# Patient Record
Sex: Male | Born: 1955 | Race: White | Hispanic: No | Marital: Married | Smoking: Never smoker
Health system: Southern US, Community
[De-identification: ages and names within clinical notes are randomized; demographics above are authoritative.]

## PROBLEM LIST (undated history)

## (undated) DIAGNOSIS — I10 Essential (primary) hypertension: Secondary | ICD-10-CM

## (undated) DIAGNOSIS — G4730 Sleep apnea, unspecified: Secondary | ICD-10-CM

## (undated) DIAGNOSIS — C6290 Malignant neoplasm of unspecified testis, unspecified whether descended or undescended: Secondary | ICD-10-CM

## (undated) DIAGNOSIS — E785 Hyperlipidemia, unspecified: Secondary | ICD-10-CM

## (undated) DIAGNOSIS — E119 Type 2 diabetes mellitus without complications: Secondary | ICD-10-CM

## (undated) HISTORY — DX: Type 2 diabetes mellitus without complications: E11.9

## (undated) HISTORY — PX: RADICAL ORCHIECTOMY: SHX2285

## (undated) HISTORY — DX: Sleep apnea, unspecified: G47.30

## (undated) HISTORY — DX: Essential (primary) hypertension: I10

## (undated) HISTORY — DX: Hyperlipidemia, unspecified: E78.5

---

## 2002-03-16 ENCOUNTER — Encounter: Payer: Self-pay | Admitting: Emergency Medicine

## 2002-03-16 ENCOUNTER — Emergency Department (HOSPITAL_COMMUNITY): Admission: EM | Admit: 2002-03-16 | Discharge: 2002-03-16 | Payer: Self-pay | Admitting: Emergency Medicine

## 2011-12-17 ENCOUNTER — Emergency Department (HOSPITAL_COMMUNITY): Payer: BC Managed Care – PPO

## 2011-12-17 ENCOUNTER — Encounter (HOSPITAL_COMMUNITY): Payer: Self-pay | Admitting: *Deleted

## 2011-12-17 ENCOUNTER — Emergency Department (HOSPITAL_COMMUNITY)
Admission: EM | Admit: 2011-12-17 | Discharge: 2011-12-18 | Disposition: A | Discharge status: Home / Self Care | Payer: BC Managed Care – PPO | Attending: Emergency Medicine | Admitting: Emergency Medicine

## 2011-12-17 DIAGNOSIS — Z8547 Personal history of malignant neoplasm of testis: Secondary | ICD-10-CM | POA: Insufficient documentation

## 2011-12-17 DIAGNOSIS — S01112A Laceration without foreign body of left eyelid and periocular area, initial encounter: Secondary | ICD-10-CM

## 2011-12-17 DIAGNOSIS — S52009A Unspecified fracture of upper end of unspecified ulna, initial encounter for closed fracture: Secondary | ICD-10-CM | POA: Insufficient documentation

## 2011-12-17 DIAGNOSIS — S0180XA Unspecified open wound of other part of head, initial encounter: Secondary | ICD-10-CM | POA: Insufficient documentation

## 2011-12-17 DIAGNOSIS — S43402A Unspecified sprain of left shoulder joint, initial encounter: Secondary | ICD-10-CM

## 2011-12-17 DIAGNOSIS — IMO0002 Reserved for concepts with insufficient information to code with codable children: Secondary | ICD-10-CM | POA: Insufficient documentation

## 2011-12-17 DIAGNOSIS — Z880 Allergy status to penicillin: Secondary | ICD-10-CM | POA: Insufficient documentation

## 2011-12-17 DIAGNOSIS — S42402A Unspecified fracture of lower end of left humerus, initial encounter for closed fracture: Secondary | ICD-10-CM

## 2011-12-17 HISTORY — DX: Malignant neoplasm of unspecified testis, unspecified whether descended or undescended: C62.90

## 2011-12-17 MED ORDER — OXYCODONE-ACETAMINOPHEN 5-325 MG PO TABS
1.0000 | ORAL_TABLET | Freq: Once | ORAL | Status: AC
  Administered 2011-12-17: 1 via ORAL
  Filled 2011-12-17: qty 1

## 2011-12-17 MED ORDER — IBUPROFEN 200 MG PO TABS
400.0000 mg | ORAL_TABLET | Freq: Once | ORAL | Status: AC
  Administered 2011-12-17: 400 mg via ORAL
  Filled 2011-12-17: qty 2
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
## 2011-12-17 NOTE — ED Notes (Signed)
No changes, to xray via w/c, pain meds given, pt discussed with Dr. Anitra Lauth, pt declining head CT at this time.

## 2011-12-17 NOTE — ED Notes (Addendum)
Back from xray via w/c, no changes, son at side. Again, "wanting to wait to see provider prior to CT head". Talking on cell phone, watching TV.

## 2011-12-18 MED ORDER — OXYCODONE-ACETAMINOPHEN 5-325 MG PO TABS: 2.0000 | ORAL_TABLET | ORAL | Status: DC | PRN

## 2011-12-18 NOTE — ED Provider Notes (Signed)
Medical screening examination/treatment/procedure(s) were performed by non-physician practitioner and as supervising physician I was immediately available for consultation/collaboration.  Sierrah Luevano M Lonisha Bobby, MD 12/18/11 0456 

## 2016-02-21 DIAGNOSIS — N390 Urinary tract infection, site not specified: Secondary | ICD-10-CM | POA: Diagnosis not present

## 2016-05-29 DIAGNOSIS — G4733 Obstructive sleep apnea (adult) (pediatric): Secondary | ICD-10-CM | POA: Diagnosis not present

## 2016-06-05 DIAGNOSIS — M542 Cervicalgia: Secondary | ICD-10-CM | POA: Diagnosis not present

## 2016-06-05 DIAGNOSIS — G8929 Other chronic pain: Secondary | ICD-10-CM | POA: Diagnosis not present

## 2016-06-05 DIAGNOSIS — M25512 Pain in left shoulder: Secondary | ICD-10-CM | POA: Diagnosis not present

## 2016-07-25 DIAGNOSIS — M5384 Other specified dorsopathies, thoracic region: Secondary | ICD-10-CM | POA: Diagnosis not present

## 2016-07-25 DIAGNOSIS — M50121 Cervical disc disorder at C4-C5 level with radiculopathy: Secondary | ICD-10-CM | POA: Diagnosis not present

## 2016-07-25 DIAGNOSIS — M9902 Segmental and somatic dysfunction of thoracic region: Secondary | ICD-10-CM | POA: Diagnosis not present

## 2016-07-25 DIAGNOSIS — M9901 Segmental and somatic dysfunction of cervical region: Secondary | ICD-10-CM | POA: Diagnosis not present

## 2016-07-30 DIAGNOSIS — M9901 Segmental and somatic dysfunction of cervical region: Secondary | ICD-10-CM | POA: Diagnosis not present

## 2016-07-30 DIAGNOSIS — M50121 Cervical disc disorder at C4-C5 level with radiculopathy: Secondary | ICD-10-CM | POA: Diagnosis not present

## 2016-07-30 DIAGNOSIS — M9902 Segmental and somatic dysfunction of thoracic region: Secondary | ICD-10-CM | POA: Diagnosis not present

## 2016-07-30 DIAGNOSIS — M5384 Other specified dorsopathies, thoracic region: Secondary | ICD-10-CM | POA: Diagnosis not present

## 2016-08-01 DIAGNOSIS — M50121 Cervical disc disorder at C4-C5 level with radiculopathy: Secondary | ICD-10-CM | POA: Diagnosis not present

## 2016-08-01 DIAGNOSIS — M9901 Segmental and somatic dysfunction of cervical region: Secondary | ICD-10-CM | POA: Diagnosis not present

## 2016-08-01 DIAGNOSIS — M9902 Segmental and somatic dysfunction of thoracic region: Secondary | ICD-10-CM | POA: Diagnosis not present

## 2016-08-01 DIAGNOSIS — M5384 Other specified dorsopathies, thoracic region: Secondary | ICD-10-CM | POA: Diagnosis not present

## 2016-08-05 DIAGNOSIS — M9902 Segmental and somatic dysfunction of thoracic region: Secondary | ICD-10-CM | POA: Diagnosis not present

## 2016-08-05 DIAGNOSIS — M50121 Cervical disc disorder at C4-C5 level with radiculopathy: Secondary | ICD-10-CM | POA: Diagnosis not present

## 2016-08-05 DIAGNOSIS — M9901 Segmental and somatic dysfunction of cervical region: Secondary | ICD-10-CM | POA: Diagnosis not present

## 2016-08-05 DIAGNOSIS — M5384 Other specified dorsopathies, thoracic region: Secondary | ICD-10-CM | POA: Diagnosis not present

## 2016-08-06 DIAGNOSIS — M50121 Cervical disc disorder at C4-C5 level with radiculopathy: Secondary | ICD-10-CM | POA: Diagnosis not present

## 2016-08-06 DIAGNOSIS — M9901 Segmental and somatic dysfunction of cervical region: Secondary | ICD-10-CM | POA: Diagnosis not present

## 2016-08-06 DIAGNOSIS — M5384 Other specified dorsopathies, thoracic region: Secondary | ICD-10-CM | POA: Diagnosis not present

## 2016-08-06 DIAGNOSIS — M9902 Segmental and somatic dysfunction of thoracic region: Secondary | ICD-10-CM | POA: Diagnosis not present

## 2016-08-19 DIAGNOSIS — M9901 Segmental and somatic dysfunction of cervical region: Secondary | ICD-10-CM | POA: Diagnosis not present

## 2016-08-19 DIAGNOSIS — M50121 Cervical disc disorder at C4-C5 level with radiculopathy: Secondary | ICD-10-CM | POA: Diagnosis not present

## 2016-08-19 DIAGNOSIS — M9902 Segmental and somatic dysfunction of thoracic region: Secondary | ICD-10-CM | POA: Diagnosis not present

## 2016-08-19 DIAGNOSIS — M5384 Other specified dorsopathies, thoracic region: Secondary | ICD-10-CM | POA: Diagnosis not present

## 2016-08-20 DIAGNOSIS — M9901 Segmental and somatic dysfunction of cervical region: Secondary | ICD-10-CM | POA: Diagnosis not present

## 2016-08-20 DIAGNOSIS — M50121 Cervical disc disorder at C4-C5 level with radiculopathy: Secondary | ICD-10-CM | POA: Diagnosis not present

## 2016-08-20 DIAGNOSIS — M5384 Other specified dorsopathies, thoracic region: Secondary | ICD-10-CM | POA: Diagnosis not present

## 2016-08-20 DIAGNOSIS — M9902 Segmental and somatic dysfunction of thoracic region: Secondary | ICD-10-CM | POA: Diagnosis not present

## 2016-08-22 DIAGNOSIS — M9902 Segmental and somatic dysfunction of thoracic region: Secondary | ICD-10-CM | POA: Diagnosis not present

## 2016-08-22 DIAGNOSIS — M5384 Other specified dorsopathies, thoracic region: Secondary | ICD-10-CM | POA: Diagnosis not present

## 2016-08-22 DIAGNOSIS — M9901 Segmental and somatic dysfunction of cervical region: Secondary | ICD-10-CM | POA: Diagnosis not present

## 2016-08-22 DIAGNOSIS — M50121 Cervical disc disorder at C4-C5 level with radiculopathy: Secondary | ICD-10-CM | POA: Diagnosis not present

## 2016-08-26 DIAGNOSIS — M9902 Segmental and somatic dysfunction of thoracic region: Secondary | ICD-10-CM | POA: Diagnosis not present

## 2016-08-26 DIAGNOSIS — M5384 Other specified dorsopathies, thoracic region: Secondary | ICD-10-CM | POA: Diagnosis not present

## 2016-08-26 DIAGNOSIS — M9901 Segmental and somatic dysfunction of cervical region: Secondary | ICD-10-CM | POA: Diagnosis not present

## 2016-08-26 DIAGNOSIS — M50121 Cervical disc disorder at C4-C5 level with radiculopathy: Secondary | ICD-10-CM | POA: Diagnosis not present

## 2016-08-27 DIAGNOSIS — M9902 Segmental and somatic dysfunction of thoracic region: Secondary | ICD-10-CM | POA: Diagnosis not present

## 2016-08-27 DIAGNOSIS — M5384 Other specified dorsopathies, thoracic region: Secondary | ICD-10-CM | POA: Diagnosis not present

## 2016-08-27 DIAGNOSIS — M9901 Segmental and somatic dysfunction of cervical region: Secondary | ICD-10-CM | POA: Diagnosis not present

## 2016-08-27 DIAGNOSIS — M50121 Cervical disc disorder at C4-C5 level with radiculopathy: Secondary | ICD-10-CM | POA: Diagnosis not present

## 2016-08-29 DIAGNOSIS — A084 Viral intestinal infection, unspecified: Secondary | ICD-10-CM | POA: Diagnosis not present

## 2016-09-02 DIAGNOSIS — M50121 Cervical disc disorder at C4-C5 level with radiculopathy: Secondary | ICD-10-CM | POA: Diagnosis not present

## 2016-09-02 DIAGNOSIS — M9902 Segmental and somatic dysfunction of thoracic region: Secondary | ICD-10-CM | POA: Diagnosis not present

## 2016-09-02 DIAGNOSIS — M5384 Other specified dorsopathies, thoracic region: Secondary | ICD-10-CM | POA: Diagnosis not present

## 2016-09-02 DIAGNOSIS — M9901 Segmental and somatic dysfunction of cervical region: Secondary | ICD-10-CM | POA: Diagnosis not present

## 2016-09-03 DIAGNOSIS — M9902 Segmental and somatic dysfunction of thoracic region: Secondary | ICD-10-CM | POA: Diagnosis not present

## 2016-09-03 DIAGNOSIS — M50121 Cervical disc disorder at C4-C5 level with radiculopathy: Secondary | ICD-10-CM | POA: Diagnosis not present

## 2016-09-03 DIAGNOSIS — M9901 Segmental and somatic dysfunction of cervical region: Secondary | ICD-10-CM | POA: Diagnosis not present

## 2016-09-03 DIAGNOSIS — M5384 Other specified dorsopathies, thoracic region: Secondary | ICD-10-CM | POA: Diagnosis not present

## 2016-09-05 DIAGNOSIS — M50121 Cervical disc disorder at C4-C5 level with radiculopathy: Secondary | ICD-10-CM | POA: Diagnosis not present

## 2016-09-05 DIAGNOSIS — M9902 Segmental and somatic dysfunction of thoracic region: Secondary | ICD-10-CM | POA: Diagnosis not present

## 2016-09-05 DIAGNOSIS — M9901 Segmental and somatic dysfunction of cervical region: Secondary | ICD-10-CM | POA: Diagnosis not present

## 2016-09-05 DIAGNOSIS — M5384 Other specified dorsopathies, thoracic region: Secondary | ICD-10-CM | POA: Diagnosis not present

## 2016-09-09 DIAGNOSIS — M9902 Segmental and somatic dysfunction of thoracic region: Secondary | ICD-10-CM | POA: Diagnosis not present

## 2016-09-09 DIAGNOSIS — M50121 Cervical disc disorder at C4-C5 level with radiculopathy: Secondary | ICD-10-CM | POA: Diagnosis not present

## 2016-09-09 DIAGNOSIS — M9901 Segmental and somatic dysfunction of cervical region: Secondary | ICD-10-CM | POA: Diagnosis not present

## 2016-09-09 DIAGNOSIS — M5384 Other specified dorsopathies, thoracic region: Secondary | ICD-10-CM | POA: Diagnosis not present

## 2016-09-10 DIAGNOSIS — M9901 Segmental and somatic dysfunction of cervical region: Secondary | ICD-10-CM | POA: Diagnosis not present

## 2016-09-10 DIAGNOSIS — M50121 Cervical disc disorder at C4-C5 level with radiculopathy: Secondary | ICD-10-CM | POA: Diagnosis not present

## 2016-09-10 DIAGNOSIS — M9902 Segmental and somatic dysfunction of thoracic region: Secondary | ICD-10-CM | POA: Diagnosis not present

## 2016-09-10 DIAGNOSIS — M5384 Other specified dorsopathies, thoracic region: Secondary | ICD-10-CM | POA: Diagnosis not present

## 2016-09-24 DIAGNOSIS — M9901 Segmental and somatic dysfunction of cervical region: Secondary | ICD-10-CM | POA: Diagnosis not present

## 2016-09-24 DIAGNOSIS — M9902 Segmental and somatic dysfunction of thoracic region: Secondary | ICD-10-CM | POA: Diagnosis not present

## 2016-09-24 DIAGNOSIS — M50121 Cervical disc disorder at C4-C5 level with radiculopathy: Secondary | ICD-10-CM | POA: Diagnosis not present

## 2016-09-24 DIAGNOSIS — M5384 Other specified dorsopathies, thoracic region: Secondary | ICD-10-CM | POA: Diagnosis not present

## 2016-10-01 DIAGNOSIS — M5384 Other specified dorsopathies, thoracic region: Secondary | ICD-10-CM | POA: Diagnosis not present

## 2016-10-01 DIAGNOSIS — M9901 Segmental and somatic dysfunction of cervical region: Secondary | ICD-10-CM | POA: Diagnosis not present

## 2016-10-01 DIAGNOSIS — M50121 Cervical disc disorder at C4-C5 level with radiculopathy: Secondary | ICD-10-CM | POA: Diagnosis not present

## 2016-10-01 DIAGNOSIS — M9902 Segmental and somatic dysfunction of thoracic region: Secondary | ICD-10-CM | POA: Diagnosis not present

## 2016-10-08 DIAGNOSIS — M9901 Segmental and somatic dysfunction of cervical region: Secondary | ICD-10-CM | POA: Diagnosis not present

## 2016-10-08 DIAGNOSIS — M9902 Segmental and somatic dysfunction of thoracic region: Secondary | ICD-10-CM | POA: Diagnosis not present

## 2016-10-08 DIAGNOSIS — M5384 Other specified dorsopathies, thoracic region: Secondary | ICD-10-CM | POA: Diagnosis not present

## 2016-10-08 DIAGNOSIS — M50121 Cervical disc disorder at C4-C5 level with radiculopathy: Secondary | ICD-10-CM | POA: Diagnosis not present

## 2016-10-09 DIAGNOSIS — E7800 Pure hypercholesterolemia, unspecified: Secondary | ICD-10-CM | POA: Diagnosis not present

## 2016-10-09 DIAGNOSIS — Z0000 Encounter for general adult medical examination without abnormal findings: Secondary | ICD-10-CM | POA: Diagnosis not present

## 2016-10-09 DIAGNOSIS — Z125 Encounter for screening for malignant neoplasm of prostate: Secondary | ICD-10-CM | POA: Diagnosis not present

## 2016-10-22 DIAGNOSIS — M9902 Segmental and somatic dysfunction of thoracic region: Secondary | ICD-10-CM | POA: Diagnosis not present

## 2016-10-22 DIAGNOSIS — M9901 Segmental and somatic dysfunction of cervical region: Secondary | ICD-10-CM | POA: Diagnosis not present

## 2016-10-22 DIAGNOSIS — M5384 Other specified dorsopathies, thoracic region: Secondary | ICD-10-CM | POA: Diagnosis not present

## 2016-10-22 DIAGNOSIS — M50121 Cervical disc disorder at C4-C5 level with radiculopathy: Secondary | ICD-10-CM | POA: Diagnosis not present

## 2016-11-19 DIAGNOSIS — Z0000 Encounter for general adult medical examination without abnormal findings: Secondary | ICD-10-CM | POA: Diagnosis not present

## 2016-12-05 DIAGNOSIS — G4733 Obstructive sleep apnea (adult) (pediatric): Secondary | ICD-10-CM | POA: Diagnosis not present

## 2017-08-19 DIAGNOSIS — H5202 Hypermetropia, left eye: Secondary | ICD-10-CM | POA: Diagnosis not present

## 2017-08-19 DIAGNOSIS — H40013 Open angle with borderline findings, low risk, bilateral: Secondary | ICD-10-CM | POA: Diagnosis not present

## 2017-08-19 DIAGNOSIS — H52201 Unspecified astigmatism, right eye: Secondary | ICD-10-CM | POA: Diagnosis not present

## 2017-08-19 DIAGNOSIS — H2513 Age-related nuclear cataract, bilateral: Secondary | ICD-10-CM | POA: Diagnosis not present

## 2017-10-16 DIAGNOSIS — Z0000 Encounter for general adult medical examination without abnormal findings: Secondary | ICD-10-CM | POA: Diagnosis not present

## 2017-10-16 DIAGNOSIS — Z125 Encounter for screening for malignant neoplasm of prostate: Secondary | ICD-10-CM | POA: Diagnosis not present

## 2017-10-16 DIAGNOSIS — E7800 Pure hypercholesterolemia, unspecified: Secondary | ICD-10-CM | POA: Diagnosis not present

## 2017-10-30 DIAGNOSIS — H2511 Age-related nuclear cataract, right eye: Secondary | ICD-10-CM | POA: Diagnosis not present

## 2017-10-30 DIAGNOSIS — H25811 Combined forms of age-related cataract, right eye: Secondary | ICD-10-CM | POA: Diagnosis not present

## 2017-11-05 DIAGNOSIS — H6690 Otitis media, unspecified, unspecified ear: Secondary | ICD-10-CM | POA: Diagnosis not present

## 2017-11-27 DIAGNOSIS — H2512 Age-related nuclear cataract, left eye: Secondary | ICD-10-CM | POA: Diagnosis not present

## 2017-11-27 DIAGNOSIS — H2511 Age-related nuclear cataract, right eye: Secondary | ICD-10-CM | POA: Diagnosis not present

## 2017-11-27 DIAGNOSIS — H25812 Combined forms of age-related cataract, left eye: Secondary | ICD-10-CM | POA: Diagnosis not present

## 2017-12-03 DIAGNOSIS — G4733 Obstructive sleep apnea (adult) (pediatric): Secondary | ICD-10-CM | POA: Diagnosis not present

## 2018-04-13 DIAGNOSIS — H40013 Open angle with borderline findings, low risk, bilateral: Secondary | ICD-10-CM | POA: Diagnosis not present

## 2018-08-11 DIAGNOSIS — L821 Other seborrheic keratosis: Secondary | ICD-10-CM | POA: Diagnosis not present

## 2018-08-11 DIAGNOSIS — D225 Melanocytic nevi of trunk: Secondary | ICD-10-CM | POA: Diagnosis not present

## 2018-08-11 DIAGNOSIS — L820 Inflamed seborrheic keratosis: Secondary | ICD-10-CM | POA: Diagnosis not present

## 2018-08-11 DIAGNOSIS — L918 Other hypertrophic disorders of the skin: Secondary | ICD-10-CM | POA: Diagnosis not present

## 2018-08-18 DIAGNOSIS — H40013 Open angle with borderline findings, low risk, bilateral: Secondary | ICD-10-CM | POA: Diagnosis not present

## 2018-08-18 DIAGNOSIS — Z961 Presence of intraocular lens: Secondary | ICD-10-CM | POA: Diagnosis not present

## 2018-10-19 DIAGNOSIS — Z0000 Encounter for general adult medical examination without abnormal findings: Secondary | ICD-10-CM | POA: Diagnosis not present

## 2018-10-19 DIAGNOSIS — N529 Male erectile dysfunction, unspecified: Secondary | ICD-10-CM | POA: Diagnosis not present

## 2018-10-19 DIAGNOSIS — E7800 Pure hypercholesterolemia, unspecified: Secondary | ICD-10-CM | POA: Diagnosis not present

## 2018-10-19 DIAGNOSIS — I10 Essential (primary) hypertension: Secondary | ICD-10-CM | POA: Diagnosis not present

## 2018-11-10 DIAGNOSIS — Z125 Encounter for screening for malignant neoplasm of prostate: Secondary | ICD-10-CM | POA: Diagnosis not present

## 2018-11-10 DIAGNOSIS — Z0000 Encounter for general adult medical examination without abnormal findings: Secondary | ICD-10-CM | POA: Diagnosis not present

## 2018-11-10 DIAGNOSIS — R7301 Impaired fasting glucose: Secondary | ICD-10-CM | POA: Diagnosis not present

## 2018-11-10 DIAGNOSIS — E7800 Pure hypercholesterolemia, unspecified: Secondary | ICD-10-CM | POA: Diagnosis not present

## 2018-12-03 DIAGNOSIS — E119 Type 2 diabetes mellitus without complications: Secondary | ICD-10-CM | POA: Diagnosis not present

## 2018-12-07 DIAGNOSIS — G4733 Obstructive sleep apnea (adult) (pediatric): Secondary | ICD-10-CM | POA: Diagnosis not present

## 2018-12-22 ENCOUNTER — Encounter: Payer: BC Managed Care – PPO | Attending: Family Medicine | Admitting: Dietician

## 2018-12-22 ENCOUNTER — Encounter: Payer: Self-pay | Admitting: Dietician

## 2018-12-22 ENCOUNTER — Other Ambulatory Visit: Payer: Self-pay

## 2018-12-22 DIAGNOSIS — E119 Type 2 diabetes mellitus without complications: Secondary | ICD-10-CM | POA: Diagnosis not present

## 2018-12-22 NOTE — Progress Notes (Signed)
Patient was seen on 12/22/2018  for the first of a series of three diabetes self-management courses at the [facility].  Patient Education Plan per assessed needs and concerns is to attend three course education program for Diabetes Self Management Education.  The following learning objectives were met by the patient during this class:  Describe diabetes, types of diabetes and pathophysiology  State some common risk factors for diabetes  Defines the role of glucose and insulin  Describe the relationship between diabetes and cardiovascular and other risks  State the members of the Healthcare Team  States the rationale for glucose monitoring and when to test  State their individual [place] the importance of logging glucose readings and how to interpret the readings  Identifies A1C target  Explain the correlation between A1c and eAG values  State symptoms and treatment of high blood glucose and low blood glucose  Explain proper technique for glucose testing and identify proper sharps disposal  Handouts given during class include:  How to Thrive:  A Guide for Your Journey with Diabetes by the ADA  Meal Plan Card and carbohydrate content list  Dietary intake form  Low Sodium Flavoring Tips  Types of Fats  Dining Out  Label reading  Snack list  Planning a balanced meal  The diabetes portion plate  Diabetes Resources  A1c to eAG Conversion Chart  Blood Glucose Log  Diabetes Recommended Care Schedule  Support Group  Diabetes Success Plan  Core Class Satisfaction Survey   Follow-Up Plan:  Attend core 2

## 2018-12-29 ENCOUNTER — Encounter: Payer: Self-pay | Admitting: Dietician

## 2018-12-29 ENCOUNTER — Encounter: Payer: BC Managed Care – PPO | Admitting: Dietician

## 2018-12-29 ENCOUNTER — Other Ambulatory Visit: Payer: Self-pay

## 2018-12-29 DIAGNOSIS — E119 Type 2 diabetes mellitus without complications: Secondary | ICD-10-CM | POA: Diagnosis not present

## 2018-12-29 NOTE — Progress Notes (Signed)
Patient was seen on 12/29/2018 for the second of a series of three diabetes self-management courses at the [facility]. The following learning objectives were met by the patient during this class:   Describe the role of different macronutrients on glucose  Explain how carbohydrates affect blood glucose  State what foods contain the most carbohydrates  Demonstrate carbohydrate counting  Demonstrate how to read Nutrition Facts food label  Describe effects of various fats on heart health  Describe the importance of good nutrition for health and healthy eating strategies  Describe techniques for managing your shopping, cooking and meal planning  List strategies to follow meal plan when dining out  Describe the effects of alcohol on glucose and how to use it safely  Goals:  Follow Diabetes Meal Plan as instructed  Aim to spread carbs evenly throughout the day  Aim for 3 meals per day and snacks as needed Include lean protein foods to meals/snacks  Monitor glucose levels as instructed by your doctor   Follow-Up Plan:  Attend Core 3  Work towards following your personal food plan.   

## 2019-01-05 ENCOUNTER — Encounter: Payer: BC Managed Care – PPO | Admitting: Dietician

## 2019-01-05 ENCOUNTER — Encounter: Payer: Self-pay | Admitting: Dietician

## 2019-01-05 ENCOUNTER — Other Ambulatory Visit: Payer: Self-pay

## 2019-01-05 DIAGNOSIS — E119 Type 2 diabetes mellitus without complications: Secondary | ICD-10-CM

## 2019-01-05 NOTE — Progress Notes (Signed)
Patient was seen on 01/05/2019 for the third of a series of three diabetes self-management courses at the [facility].   Omar Owens the amount of activity recommended for healthy living . Describe activities suitable for individual needs . Identify ways to regularly incorporate activity into daily life . Identify barriers to activity and ways to over come these barriers  Identify diabetes medications being personally used and their primary action for lowering glucose and possible side effects . Describe role of stress on blood glucose and develop strategies to address psychosocial issues . Identify diabetes complications and ways to prevent them  Explain how to manage diabetes during illness . Evaluate success in meeting personal goal . Establish 2-3 goals that they will plan to diligently work on  Goals:   I will count my carb choices at most meals and snacks and aim for 4-5 carb choices per meal  I will be active 30 minutes or more 5 times a week  I will take my diabetes medications as scheduled  I will eat less unhealthy fats by eating less cheese and choosing 93% lean ground beef and using less butter etc.  I will test my glucose at least 1 times a day, 7 days a week  To help manage stress I will  exercise at least 5 times a week  Your patient has identified these potential barriers to change:  None  Your patient has identified their diabetes self-care support plan as  On-line Resources    Plan:  Attend Support Group as desired

## 2019-02-17 DIAGNOSIS — H40013 Open angle with borderline findings, low risk, bilateral: Secondary | ICD-10-CM | POA: Diagnosis not present

## 2019-02-26 DIAGNOSIS — E1169 Type 2 diabetes mellitus with other specified complication: Secondary | ICD-10-CM | POA: Diagnosis not present

## 2019-02-26 DIAGNOSIS — E7800 Pure hypercholesterolemia, unspecified: Secondary | ICD-10-CM | POA: Diagnosis not present

## 2019-03-01 DIAGNOSIS — E1169 Type 2 diabetes mellitus with other specified complication: Secondary | ICD-10-CM | POA: Diagnosis not present

## 2019-03-01 DIAGNOSIS — I10 Essential (primary) hypertension: Secondary | ICD-10-CM | POA: Diagnosis not present

## 2019-06-02 DIAGNOSIS — I10 Essential (primary) hypertension: Secondary | ICD-10-CM | POA: Diagnosis not present

## 2019-06-02 DIAGNOSIS — E1169 Type 2 diabetes mellitus with other specified complication: Secondary | ICD-10-CM | POA: Diagnosis not present

## 2019-06-02 DIAGNOSIS — E7800 Pure hypercholesterolemia, unspecified: Secondary | ICD-10-CM | POA: Diagnosis not present

## 2019-06-03 ENCOUNTER — Ambulatory Visit: Payer: BC Managed Care – PPO | Attending: Internal Medicine

## 2019-06-03 DIAGNOSIS — Z23 Encounter for immunization: Secondary | ICD-10-CM

## 2019-06-03 NOTE — Progress Notes (Signed)
   Covid-19 Vaccination Clinic  Name:  Omar Owens    MRN: [id number] DOB: 02/04/1956  06/03/2019  Omar Owens was observed post Covid-19 immunization for 15 minutes without incident. He was provided with Vaccine Information Sheet and instruction to access the V-Safe system.   Omar Owens was instructed to call 911 with any severe reactions post vaccine: Marland Kitchen Difficulty breathing  . Swelling of face and throat  . A fast heartbeat  . A bad rash all over body  . Dizziness and weakness   Immunizations Administered    Name Date Dose VIS Date Route   Pfizer COVID-19 Vaccine 06/03/2019  1:11 PM 0.3 mL 02/26/2019 Intramuscular   Manufacturer: [place]   Lot: [id number]   [place]: [id number]

## 2019-06-28 ENCOUNTER — Ambulatory Visit: Payer: BC Managed Care – PPO | Attending: Internal Medicine

## 2019-06-28 DIAGNOSIS — Z23 Encounter for immunization: Secondary | ICD-10-CM

## 2019-06-28 NOTE — Progress Notes (Signed)
   Covid-19 Vaccination Clinic  Name:  CROY BUSAM    MRN: [id number] DOB: 03-09-1956  06/28/2019  Mr. Dinges was observed post Covid-19 immunization for 15 minutes without incident. He was provided with Vaccine Information Sheet and instruction to access the V-Safe system.   Mr. Fawson was instructed to call 911 with any severe reactions post vaccine: Marland Kitchen Difficulty breathing  . Swelling of face and throat  . A fast heartbeat  . A bad rash all over body  . Dizziness and weakness   Immunizations Administered    Name Date Dose VIS Date Route   Pfizer COVID-19 Vaccine 06/28/2019  2:32 PM 0.3 mL 02/26/2019 Intramuscular   Manufacturer: [place]   Lot: [id number]   [place]: [id number]

## 2019-07-14 DIAGNOSIS — R748 Abnormal levels of other serum enzymes: Secondary | ICD-10-CM | POA: Diagnosis not present

## 2019-08-11 DIAGNOSIS — D2272 Melanocytic nevi of left lower limb, including hip: Secondary | ICD-10-CM | POA: Diagnosis not present

## 2019-08-11 DIAGNOSIS — D2371 Other benign neoplasm of skin of right lower limb, including hip: Secondary | ICD-10-CM | POA: Diagnosis not present

## 2019-08-11 DIAGNOSIS — L918 Other hypertrophic disorders of the skin: Secondary | ICD-10-CM | POA: Diagnosis not present

## 2019-08-11 DIAGNOSIS — D235 Other benign neoplasm of skin of trunk: Secondary | ICD-10-CM | POA: Diagnosis not present

## 2019-08-18 DIAGNOSIS — E119 Type 2 diabetes mellitus without complications: Secondary | ICD-10-CM | POA: Diagnosis not present

## 2019-08-18 DIAGNOSIS — H40013 Open angle with borderline findings, low risk, bilateral: Secondary | ICD-10-CM | POA: Diagnosis not present

## 2019-08-18 DIAGNOSIS — H33311 Horseshoe tear of retina without detachment, right eye: Secondary | ICD-10-CM | POA: Diagnosis not present

## 2019-08-18 DIAGNOSIS — H33301 Unspecified retinal break, right eye: Secondary | ICD-10-CM | POA: Diagnosis not present

## 2019-08-18 DIAGNOSIS — H43391 Other vitreous opacities, right eye: Secondary | ICD-10-CM | POA: Diagnosis not present

## 2019-08-18 DIAGNOSIS — H43811 Vitreous degeneration, right eye: Secondary | ICD-10-CM | POA: Diagnosis not present

## 2019-08-18 DIAGNOSIS — H43822 Vitreomacular adhesion, left eye: Secondary | ICD-10-CM | POA: Diagnosis not present

## 2019-09-07 DIAGNOSIS — H33311 Horseshoe tear of retina without detachment, right eye: Secondary | ICD-10-CM | POA: Diagnosis not present

## 2019-09-07 DIAGNOSIS — H31091 Other chorioretinal scars, right eye: Secondary | ICD-10-CM | POA: Diagnosis not present

## 2019-09-08 ENCOUNTER — Other Ambulatory Visit: Payer: Self-pay

## 2019-09-08 ENCOUNTER — Ambulatory Visit (INDEPENDENT_AMBULATORY_CARE_PROVIDER_SITE_OTHER): Payer: BC Managed Care – PPO | Admitting: Podiatry

## 2019-09-08 ENCOUNTER — Ambulatory Visit (INDEPENDENT_AMBULATORY_CARE_PROVIDER_SITE_OTHER): Payer: BC Managed Care – PPO

## 2019-09-08 ENCOUNTER — Encounter: Payer: Self-pay | Admitting: Podiatry

## 2019-09-08 DIAGNOSIS — M722 Plantar fascial fibromatosis: Secondary | ICD-10-CM

## 2019-09-08 DIAGNOSIS — M79671 Pain in right foot: Secondary | ICD-10-CM | POA: Diagnosis not present

## 2019-09-08 NOTE — Progress Notes (Signed)
  Subjective:  Patient ID: Omar Owens, male    DOB: 11/15/1955,  MRN: [id number]  No chief complaint on file.   64 y.o. male presents with the above complaint.  Patient presents with right heel pain at the bottom of the heel.  Patient states been going on for quite some time.  Has progressive gotten worse.  Is painful to walk on.  It has been going on for a month.  Is dull achy pain.  Pain scale 7 out of 10.  He has tried Epson salt soaks but has not helped much.  He denies any other acute complaints.  He would like to discuss treatment options he has not seen anyone else prior to seeing me.   Review of Systems: Negative except as noted in the HPI. Denies N/V/F/Ch.  Past Medical History:  Diagnosis Date  . Diabetes mellitus without complication ([place])   . Hyperlipidemia   . Hypertension   . Sleep apnea   . Testicular cancer [facility])     Current Outpatient Medications:  .  lisinopril (ZESTRIL) 20 MG tablet, Take 20 mg by mouth daily., Disp: , Rfl:  .  metFORMIN (GLUCOPHAGE) 500 MG tablet, Take 500 mg by mouth at bedtime., Disp: , Rfl:  .  oxyCODONE-acetaminophen (PERCOCET/ROXICET) 5-325 MG per tablet, Take 2 tablets by mouth every 4 (four) hours as needed for pain. (Patient not taking: Reported on 12/22/2018), Disp: 20 tablet, Rfl: 0  Social History   Tobacco Use  Smoking Status Never Smoker    Allergies  Allergen Reactions  . Penicillins Hives   Objective:  There were no vitals filed for this visit. There is no height or weight on file to calculate BMI. Constitutional Well developed. Well nourished.  Vascular Dorsalis pedis pulses palpable bilaterally. Posterior tibial pulses palpable bilaterally. Capillary refill normal to all digits.  No cyanosis or clubbing noted. Pedal hair growth normal.  Neurologic Normal speech. Oriented to person, place, and time. Epicritic sensation to light touch grossly present bilaterally.  Dermatologic Nails well groomed and normal in  appearance. No open wounds. No skin lesions.  Orthopedic: Normal joint ROM without pain or crepitus bilaterally. No visible deformities. Tender to palpation at the calcaneal tuber right. No pain with calcaneal squeeze right. Ankle ROM diminished range of motion right. Silfverskiold Test: positive right.   Radiographs: Taken and reviewed. No acute fractures or dislocations. No evidence of stress fracture.  Plantar heel spur present. Posterior heel spur present.   Assessment:   1. Foot pain, right   2. Plantar fasciitis of right foot    Plan:  Patient was evaluated and treated and all questions answered.  Plantar Fasciitis, right - XR reviewed as above.  - Educated on icing and stretching. Instructions given.  - Injection delivered to the plantar fascia as below. - DME: Plantar Fascial Brace - Pharmacologic management: Meloxicam/Medrol Dose Pak. Educated on risks/benefits and proper taking of medication.  Procedure: Injection Tendon/Ligament Location: Right plantar fascia at the glabrous junction; medial approach. Skin Prep: alcohol Injectate: 0.5 cc 0.5% marcaine plain, 0.5 cc of 1% Lidocaine, 0.5 cc kenalog 10. Disposition: Patient tolerated procedure well. Injection site dressed with a band-aid.  No follow-ups on file.

## 2019-09-09 ENCOUNTER — Other Ambulatory Visit: Payer: Self-pay | Admitting: Podiatry

## 2019-09-09 DIAGNOSIS — M722 Plantar fascial fibromatosis: Secondary | ICD-10-CM

## 2019-10-08 ENCOUNTER — Ambulatory Visit (INDEPENDENT_AMBULATORY_CARE_PROVIDER_SITE_OTHER): Payer: BC Managed Care – PPO | Admitting: Podiatry

## 2019-10-08 ENCOUNTER — Encounter: Payer: Self-pay | Admitting: Podiatry

## 2019-10-08 ENCOUNTER — Other Ambulatory Visit: Payer: Self-pay

## 2019-10-08 DIAGNOSIS — Q666 Other congenital valgus deformities of feet: Secondary | ICD-10-CM | POA: Diagnosis not present

## 2019-10-08 DIAGNOSIS — M79671 Pain in right foot: Secondary | ICD-10-CM

## 2019-10-08 DIAGNOSIS — M722 Plantar fascial fibromatosis: Secondary | ICD-10-CM | POA: Diagnosis not present

## 2019-10-08 NOTE — Progress Notes (Signed)
Subjective:  Patient ID: Omar Owens, male    DOB: 12-18-1955,  MRN: [id number]  Chief Complaint  Patient presents with  . Follow-up    4WK F/u- pt is doing alot better than before- injection helped alot with pain- brace is keep suppport    64 y.o. male presents with the above complaint.  Patient presents to follow-up with a right plantar fasciitis.  Patient states he is doing much better.  The injection helped the brace helped.  He has been ambulating with regular sneakers.  He denies any other acute complaints.  He would like to discuss further future treatment options.  He does not have orthotics.   Review of Systems: Negative except as noted in the HPI. Denies N/V/F/Ch.  Past Medical History:  Diagnosis Date  . Diabetes mellitus without complication ([place])   . Hyperlipidemia   . Hypertension   . Sleep apnea   . Testicular cancer [facility])     Current Outpatient Medications:  .  lisinopril (ZESTRIL) 20 MG tablet, Take 20 mg by mouth daily., Disp: , Rfl:  .  metFORMIN (GLUCOPHAGE) 500 MG tablet, Take 500 mg by mouth at bedtime., Disp: , Rfl:  .  oxyCODONE-acetaminophen (PERCOCET/ROXICET) 5-325 MG per tablet, Take 2 tablets by mouth every 4 (four) hours as needed for pain. (Patient not taking: Reported on 12/22/2018), Disp: 20 tablet, Rfl: 0  Social History   Tobacco Use  Smoking Status Never Smoker  Smokeless Tobacco Never Used    Allergies  Allergen Reactions  . Penicillins Hives   Objective:  There were no vitals filed for this visit. There is no height or weight on file to calculate BMI. Constitutional Well developed. Well nourished.  Vascular Dorsalis pedis pulses palpable bilaterally. Posterior tibial pulses palpable bilaterally. Capillary refill normal to all digits.  No cyanosis or clubbing noted. Pedal hair growth normal.  Neurologic Normal speech. Oriented to person, place, and time. Epicritic sensation to light touch grossly present bilaterally.    Dermatologic Nails well groomed and normal in appearance. No open wounds. No skin lesions.  Orthopedic: Normal joint ROM without pain or crepitus bilaterally. No visible deformities. Tender to palpation at the calcaneal tuber right. No pain with calcaneal squeeze right. Ankle ROM diminished range of motion right. Silfverskiold Test: positive right.   Radiographs: Taken and reviewed. No acute fractures or dislocations. No evidence of stress fracture.  Plantar heel spur present. Posterior heel spur present.   Assessment:   1. Pes planovalgus   2. Plantar fasciitis of right foot   3. Foot pain, right    Plan:  Patient was evaluated and treated and all questions answered.  Plantar Fasciitis, right - XR reviewed as above.  - Educated on icing and stretching. Instructions given.  -Second injection delivered to the plantar fascia as below. - DME: Continue wearing plantar fascial brace - Pharmacologic management: None  Semiflexible pes planovalgus -I explained to the patient the etiology of pes planovalgus and various treatment options were discussed in relationship with plantar fasciitis.  I believe patient will benefit from custom-made orthotics to help support the arch of the foot as well as control the hindfoot motion.  Patient agrees with the plan would like to proceed with getting orthotics. -He will be scheduled to see [facility] for custom-made orthotics.  Procedure: Injection Tendon/Ligament Location: Right plantar fascia at the glabrous junction; medial approach. Skin Prep: alcohol Injectate: 0.5 cc 0.5% marcaine plain, 0.5 cc of 1% Lidocaine, 0.5 cc kenalog 10. Disposition: Patient tolerated  procedure well. Injection site dressed with a band-aid.  Return in about 4 weeks (around 11/05/2019) for See Liliane Channel for orthotics ASAP.

## 2019-10-14 ENCOUNTER — Other Ambulatory Visit: Payer: Self-pay

## 2019-10-14 ENCOUNTER — Ambulatory Visit (INDEPENDENT_AMBULATORY_CARE_PROVIDER_SITE_OTHER): Payer: BC Managed Care – PPO | Admitting: Orthotics

## 2019-10-14 DIAGNOSIS — M722 Plantar fascial fibromatosis: Secondary | ICD-10-CM | POA: Diagnosis not present

## 2019-10-14 DIAGNOSIS — Q666 Other congenital valgus deformities of feet: Secondary | ICD-10-CM

## 2019-10-14 NOTE — Progress Notes (Signed)

## 2019-10-21 DIAGNOSIS — E119 Type 2 diabetes mellitus without complications: Secondary | ICD-10-CM | POA: Diagnosis not present

## 2019-10-21 DIAGNOSIS — M549 Dorsalgia, unspecified: Secondary | ICD-10-CM | POA: Diagnosis not present

## 2019-10-29 DIAGNOSIS — E1169 Type 2 diabetes mellitus with other specified complication: Secondary | ICD-10-CM | POA: Diagnosis not present

## 2019-11-04 ENCOUNTER — Ambulatory Visit: Payer: BC Managed Care – PPO | Admitting: Orthotics

## 2019-11-04 ENCOUNTER — Other Ambulatory Visit: Payer: Self-pay

## 2019-11-04 DIAGNOSIS — M722 Plantar fascial fibromatosis: Secondary | ICD-10-CM

## 2019-11-04 DIAGNOSIS — Q666 Other congenital valgus deformities of feet: Secondary | ICD-10-CM

## 2019-11-04 NOTE — Progress Notes (Signed)
Patient came in today to pick up custom made foot orthotics.  The goals were accomplished and the patient reported no dissatisfaction with said orthotics.  Patient was advised of breakin period and how to report any issues. 

## 2019-11-05 ENCOUNTER — Ambulatory Visit (INDEPENDENT_AMBULATORY_CARE_PROVIDER_SITE_OTHER): Payer: BC Managed Care – PPO | Admitting: Podiatry

## 2019-11-05 DIAGNOSIS — M79671 Pain in right foot: Secondary | ICD-10-CM

## 2019-11-05 DIAGNOSIS — Q666 Other congenital valgus deformities of feet: Secondary | ICD-10-CM | POA: Diagnosis not present

## 2019-11-05 DIAGNOSIS — M722 Plantar fascial fibromatosis: Secondary | ICD-10-CM | POA: Diagnosis not present

## 2019-11-09 ENCOUNTER — Encounter: Payer: Self-pay | Admitting: Podiatry

## 2019-11-09 NOTE — Progress Notes (Signed)
  Subjective:  Patient ID: Omar Owens, male    DOB: May 29, 1955,  MRN: [id number]  Chief Complaint  Patient presents with  . Foot Pain    4 wk fu RT heel pain    64 y.o. male presents with the above complaint.  Patient presents to follow-up with a right plantar fasciitis.  Patient states he is doing much better.  The injection helped the brace helped.  He has been ambulating with regular sneakers.  He denies any other acute complaints.  He has obtained orthotics.  He is ambulating in them well.   Review of Systems: Negative except as noted in the HPI. Denies N/V/F/Ch.  Past Medical History:  Diagnosis Date  . Diabetes mellitus without complication ([place])   . Hyperlipidemia   . Hypertension   . Sleep apnea   . Testicular cancer [facility])     Current Outpatient Medications:  .  lisinopril (ZESTRIL) 20 MG tablet, Take 20 mg by mouth daily., Disp: , Rfl:  .  metFORMIN (GLUCOPHAGE) 500 MG tablet, Take 500 mg by mouth at bedtime., Disp: , Rfl:  .  oxyCODONE-acetaminophen (PERCOCET/ROXICET) 5-325 MG per tablet, Take 2 tablets by mouth every 4 (four) hours as needed for pain. (Patient not taking: Reported on 12/22/2018), Disp: 20 tablet, Rfl: 0  Social History   Tobacco Use  Smoking Status Never Smoker  Smokeless Tobacco Never Used    Allergies  Allergen Reactions  . Penicillins Hives   Objective:  There were no vitals filed for this visit. There is no height or weight on file to calculate BMI. Constitutional Well developed. Well nourished.  Vascular Dorsalis pedis pulses palpable bilaterally. Posterior tibial pulses palpable bilaterally. Capillary refill normal to all digits.  No cyanosis or clubbing noted. Pedal hair growth normal.  Neurologic Normal speech. Oriented to person, place, and time. Epicritic sensation to light touch grossly present bilaterally.  Dermatologic Nails well groomed and normal in appearance. No open wounds. No skin lesions.  Orthopedic: Normal  joint ROM without pain or crepitus bilaterally. No visible deformities. No tender to palpation at the calcaneal tuber right. No pain with calcaneal squeeze right. Ankle ROM diminished range of motion right. Silfverskiold Test: positive right.   Radiographs: Taken and reviewed. No acute fractures or dislocations. No evidence of stress fracture.  Plantar heel spur present. Posterior heel spur present.   Assessment:   1. Plantar fasciitis of right foot   2. Pes planovalgus   3. Foot pain, right    Plan:  Patient was evaluated and treated and all questions answered.  Plantar Fasciitis, right -Clinically resolved.  At this point patient is officially discharged from my care.  I have asked if any foot and ankle arises issues to come back and see me.  He states understanding.  Semiflexible pes planovalgus -I explained to the patient the etiology of pes planovalgus and various treatment options were discussed in relationship with plantar fasciitis.  I believe patient will benefit from custom-made orthotics to help support the arch of the foot as well as control the hindfoot motion.  Patient agrees with the plan would like to proceed with getting orthotics. -Patient has picked up orthotics and have started breaking in.  I discussed with him the importance of breaking in..  Patient states understanding.    No follow-ups on file.

## 2019-12-15 DIAGNOSIS — G4733 Obstructive sleep apnea (adult) (pediatric): Secondary | ICD-10-CM | POA: Diagnosis not present

## 2020-01-24 DIAGNOSIS — Z125 Encounter for screening for malignant neoplasm of prostate: Secondary | ICD-10-CM | POA: Diagnosis not present

## 2020-01-24 DIAGNOSIS — Z1159 Encounter for screening for other viral diseases: Secondary | ICD-10-CM | POA: Diagnosis not present

## 2020-01-24 DIAGNOSIS — Z0000 Encounter for general adult medical examination without abnormal findings: Secondary | ICD-10-CM | POA: Diagnosis not present

## 2020-01-24 DIAGNOSIS — I10 Essential (primary) hypertension: Secondary | ICD-10-CM | POA: Diagnosis not present

## 2020-01-24 DIAGNOSIS — E1169 Type 2 diabetes mellitus with other specified complication: Secondary | ICD-10-CM | POA: Diagnosis not present

## 2020-01-24 DIAGNOSIS — E669 Obesity, unspecified: Secondary | ICD-10-CM | POA: Diagnosis not present

## 2020-01-24 DIAGNOSIS — E7800 Pure hypercholesterolemia, unspecified: Secondary | ICD-10-CM | POA: Diagnosis not present

## 2020-05-23 ENCOUNTER — Emergency Department (HOSPITAL_BASED_OUTPATIENT_CLINIC_OR_DEPARTMENT_OTHER): Payer: BC Managed Care – PPO

## 2020-05-23 ENCOUNTER — Other Ambulatory Visit: Payer: Self-pay

## 2020-05-23 ENCOUNTER — Emergency Department (HOSPITAL_BASED_OUTPATIENT_CLINIC_OR_DEPARTMENT_OTHER)
Admission: EM | Admit: 2020-05-23 | Discharge: 2020-05-23 | Disposition: A | Discharge status: Home / Self Care | Payer: BC Managed Care – PPO | Attending: Emergency Medicine | Admitting: Emergency Medicine

## 2020-05-23 ENCOUNTER — Encounter (HOSPITAL_BASED_OUTPATIENT_CLINIC_OR_DEPARTMENT_OTHER): Payer: Self-pay | Admitting: Emergency Medicine

## 2020-05-23 DIAGNOSIS — I10 Essential (primary) hypertension: Secondary | ICD-10-CM | POA: Diagnosis not present

## 2020-05-23 DIAGNOSIS — R109 Unspecified abdominal pain: Secondary | ICD-10-CM | POA: Diagnosis not present

## 2020-05-23 DIAGNOSIS — Z8547 Personal history of malignant neoplasm of testis: Secondary | ICD-10-CM | POA: Diagnosis not present

## 2020-05-23 DIAGNOSIS — E119 Type 2 diabetes mellitus without complications: Secondary | ICD-10-CM | POA: Insufficient documentation

## 2020-05-23 DIAGNOSIS — Z7984 Long term (current) use of oral hypoglycemic drugs: Secondary | ICD-10-CM | POA: Diagnosis not present

## 2020-05-23 DIAGNOSIS — R599 Enlarged lymph nodes, unspecified: Secondary | ICD-10-CM | POA: Insufficient documentation

## 2020-05-23 DIAGNOSIS — Z79899 Other long term (current) drug therapy: Secondary | ICD-10-CM | POA: Diagnosis not present

## 2020-05-23 DIAGNOSIS — N200 Calculus of kidney: Secondary | ICD-10-CM | POA: Diagnosis not present

## 2020-05-23 LAB — CBC WITH DIFFERENTIAL/PLATELET
Abs Immature Granulocytes: 0.08 10*3/uL — ABNORMAL HIGH (ref 0.00–0.07)
Basophils Absolute: 0 10*3/uL (ref 0.0–0.1)
Basophils Relative: 0 %
Eosinophils Absolute: 0 10*3/uL (ref 0.0–0.5)
Eosinophils Relative: 0 %
HCT: 42.6 % (ref 39.0–52.0)
Hemoglobin: 13.5 g/dL (ref 13.0–17.0)
Immature Granulocytes: 1 %
Lymphocytes Relative: 12 %
Lymphs Abs: 1.7 10*3/uL (ref 0.7–4.0)
MCH: 26.1 pg (ref 26.0–34.0)
MCHC: 31.7 g/dL (ref 30.0–36.0)
MCV: 82.2 fL (ref 80.0–100.0)
Monocytes Absolute: 1 10*3/uL (ref 0.1–1.0)
Monocytes Relative: 7 %
Neutro Abs: 11.2 10*3/uL — ABNORMAL HIGH (ref 1.7–7.7)
Neutrophils Relative %: 80 %
Platelets: 317 10*3/uL (ref 150–400)
RBC: 5.18 MIL/uL (ref 4.22–5.81)
RDW: 14.9 % (ref 11.5–15.5)
WBC: 14 10*3/uL — ABNORMAL HIGH (ref 4.0–10.5)
nRBC: 0 % (ref 0.0–0.2)

## 2020-05-23 LAB — URINALYSIS, ROUTINE W REFLEX MICROSCOPIC
Bilirubin Urine: NEGATIVE
Glucose, UA: NEGATIVE mg/dL
Hgb urine dipstick: NEGATIVE
Ketones, ur: NEGATIVE mg/dL
Leukocytes,Ua: NEGATIVE
Nitrite: NEGATIVE
Protein, ur: NEGATIVE mg/dL
Specific Gravity, Urine: 1.016 (ref 1.005–1.030)
pH: 6 (ref 5.0–8.0)

## 2020-05-23 LAB — COMPREHENSIVE METABOLIC PANEL
ALT: 46 U/L — ABNORMAL HIGH (ref 0–44)
AST: 20 U/L (ref 15–41)
Albumin: 4.3 g/dL (ref 3.5–5.0)
Alkaline Phosphatase: 78 U/L (ref 38–126)
Anion gap: 9 (ref 5–15)
BUN: 14 mg/dL (ref 8–23)
CO2: 27 mmol/L (ref 22–32)
Calcium: 9 mg/dL (ref 8.9–10.3)
Chloride: 100 mmol/L (ref 98–111)
Creatinine, Ser: 1.01 mg/dL (ref 0.61–1.24)
GFR, Estimated: >60 mL/min (ref >60)
Glucose, Bld: 172 mg/dL — ABNORMAL HIGH (ref 70–99)
Potassium: 4.4 mmol/L (ref 3.5–5.1)
Sodium: 136 mmol/L (ref 135–145)
Total Bilirubin: 0.8 mg/dL (ref 0.3–1.2)
Total Protein: 7 g/dL (ref 6.5–8.1)

## 2020-05-23 MED ORDER — ONDANSETRON HCL 4 MG/2ML IJ SOLN
4.0000 mg | Freq: Once | INTRAMUSCULAR | Status: AC
  Administered 2020-05-23: 4 mg via INTRAVENOUS
  Filled 2020-05-23: qty 2

## 2020-05-23 MED ORDER — HYDROCODONE-ACETAMINOPHEN 5-325 MG PO TABS: 1.0000 | ORAL_TABLET | ORAL | 0 refills | Status: DC | PRN

## 2020-05-23 MED ORDER — ONDANSETRON HCL 4 MG PO TABS: 4.0000 mg | ORAL_TABLET | Freq: Four times a day (QID) | ORAL | 0 refills | Status: DC

## 2020-05-23 MED ORDER — MORPHINE SULFATE (PF) 4 MG/ML IV SOLN
4.0000 mg | Freq: Once | INTRAVENOUS | Status: AC
  Administered 2020-05-23: 4 mg via INTRAVENOUS
  Filled 2020-05-23: qty 1

## 2020-05-23 MED ORDER — SODIUM CHLORIDE 0.9 % IV BOLUS
1000.0000 mL | Freq: Once | INTRAVENOUS | Status: AC
  Administered 2020-05-23: 1000 mL via INTRAVENOUS

## 2020-05-23 MED ORDER — TAMSULOSIN HCL 0.4 MG PO CAPS: 0.4000 mg | ORAL_CAPSULE | Freq: Every day | ORAL | 0 refills | Status: AC

## 2020-05-23 NOTE — ED Provider Notes (Signed)
[facility] EMERGENCY DEPT Provider Note   CSN: [id number] Arrival date & time: 05/23/20  1125     History Chief Complaint  Patient presents with  . Abdominal Pain  . Flank Pain    Omar Owens is a 65 y.o. male.  HPI     65yo male with history of DM, htn, hlpd, sleep apnea, testicular cancer, nephrolithiasis, presents with concern for left flank pain.  Began two days ago, waxing and waning stabbing to the left flank> No radiation to abdomen. No fevers, chills, diarrhea, constipation or urinary symptoms. Has had nausea with a few episodes of emesis yetserday. Feels similar to prior kidney stone 69yrs ago> Cannot get comfortable. 8/10 in severity at its worst and then improves.  No exacerbating or relieving factors.   Past Medical History:  Diagnosis Date  . Diabetes mellitus without complication ([place])   . Hyperlipidemia   . Hypertension   . Sleep apnea   . Testicular cancer ([place])    1986 testicular    There are no problems to display for this patient.   Past Surgical History:  Procedure Laterality Date  . RADICAL ORCHIECTOMY     L, r/t CA       No family history on file.  Social History   Tobacco Use  . Smoking status: Never Smoker  . Smokeless tobacco: Never Used  Vaping Use  . Vaping Use: Never used  Substance Use Topics  . Alcohol use: Yes    Comment: occasionally  . Drug use: No    Home Medications Prior to Admission medications   Medication Sig Start Date End Date Taking? Authorizing Provider  HYDROcodone-acetaminophen (NORCO/VICODIN) 5-325 MG tablet Take 1 tablet by mouth every 4 (four) hours as needed. 05/23/20  Yes Gareth Morgan, MD  lisinopril (ZESTRIL) 20 MG tablet Take 20 mg by mouth daily.   Yes [provider]  metFORMIN (GLUCOPHAGE) 500 MG tablet Take 500 mg by mouth at bedtime.   Yes [provider]  ondansetron (ZOFRAN) 4 MG tablet Take 1 tablet (4 mg total) by mouth every 6 (six) hours. 05/23/20   Yes Gareth Morgan, MD  tamsulosin (FLOMAX) 0.4 MG CAPS capsule Take 1 capsule (0.4 mg total) by mouth daily after supper for 14 days. 05/23/20 06/06/20 Yes Gareth Morgan, MD    Allergies    Penicillins  Review of Systems   Review of Systems  Constitutional: Negative for fever.  Respiratory: Negative for shortness of breath.   Cardiovascular: Negative for chest pain.  Gastrointestinal: Positive for nausea and vomiting. Negative for abdominal pain, constipation and diarrhea.  Genitourinary: Positive for flank pain. Negative for difficulty urinating.  Musculoskeletal: Positive for back pain.  Skin: Negative for rash.  Neurological: Negative for syncope.    Physical Exam Updated Vital Signs BP (!) 145/74   Pulse 72   Temp 99.3 F (37.4 C) (Oral)   Resp 11   Ht 5' 6.5" (1.689 m)   Wt 111.1 kg   SpO2 99%   BMI 38.95 kg/m   Physical Exam Vitals and nursing note reviewed.  Constitutional:      General: He is not in acute distress.    Appearance: Normal appearance. He is not ill-appearing, toxic-appearing or diaphoretic.  HENT:     Head: Normocephalic.  Eyes:     Conjunctiva/sclera: Conjunctivae normal.  Cardiovascular:     Rate and Rhythm: Normal rate and regular rhythm.     Pulses: Normal pulses.  Pulmonary:     Effort:  Pulmonary effort is normal. No respiratory distress.  Abdominal:     General: There is no distension or abdominal bruit.     Palpations: Abdomen is soft.     Tenderness: There is no abdominal tenderness.  Musculoskeletal:        General: No deformity or signs of injury.     Cervical back: No rigidity.  Skin:    General: Skin is warm and dry.     Coloration: Skin is not jaundiced or pale.  Neurological:     General: No focal deficit present.     Mental Status: He is alert and oriented to person, place, and time.     ED Results / Procedures / Treatments   Labs (all labs ordered are listed, but only abnormal results are displayed) Labs  Reviewed  CBC WITH DIFFERENTIAL/PLATELET - Abnormal; Notable for the following components:      Result Value   WBC 14.0 (*)    Neutro Abs 11.2 (*)    Abs Immature Granulocytes 0.08 (*)    All other components within normal limits  COMPREHENSIVE METABOLIC PANEL - Abnormal; Notable for the following components:   Glucose, Bld 172 (*)    ALT 46 (*)    All other components within normal limits  URINE CULTURE  URINALYSIS, ROUTINE W REFLEX MICROSCOPIC    EKG None  Radiology CT Renal Stone Study  Result Date: 05/23/2020 CLINICAL DATA:  Left flank pain with nausea and vomiting. History of testicular carcinoma EXAM: CT ABDOMEN AND PELVIS WITHOUT CONTRAST TECHNIQUE: Multidetector CT imaging of the abdomen and pelvis was performed following the standard protocol without oral or IV contrast. COMPARISON:  March 16, 2002 FINDINGS: Lower chest: Visualized lung bases clear. Hepatobiliary: Superior most aspect of the liver not imaged. There is hepatic steatosis throughout visualized liver. No focal liver lesions evident on noncontrast enhanced study. Gallbladder wall is not appreciably thickened. There is no biliary duct dilatation. Pancreas: There is no pancreatic mass or inflammatory focus. Spleen: No splenic lesions evident. Adrenals/Urinary Tract: Adrenals bilaterally appear normal. There is a cyst arising from the posterior aspect of the left kidney measuring 1.1 x 1.0 cm. There is a cyst arising from the right kidney medially measuring 2.7 by 2.4 cm. There is mild hydronephrosis on the left. There is no appreciable hydronephrosis on the right. On the left, there is a calculus in the upper pole region measuring 7 x 6 mm. There is a nearby 1 mm calculus in the upper pole left kidney region. There is a 2 mm calculus in the anterior lower pole right kidney. There is also a 1 mm calculus in the mid right kidney. There is a calculus in the proximal left ureter at the L3-4 level measuring 4 x 3 mm. No other  ureteral calculi are evident. The urinary bladder wall is not thickened. There is a diverticulum arising from the anterior inferior aspect of the bladder which extends to the origin of a right inguinal hernia but is not entrapped within hernia. No bladder compromise evident. Stomach/Bowel: There is no appreciable bowel wall thickening. No evident bowel obstruction. The terminal ileum appears normal. There is no evident free air or portal venous air. Vascular/Lymphatic: There are foci of aortic and iliac atherosclerosis. No evident abdominal aortic aneurysm. There is a mildly prominent lymph node inferior to the aorta in the distal thoracic region measuring 1.4 x 1.1 cm, seen on axial slice 11 series 2. No retroperitoneal adenopathy evident. No adenopathy elsewhere in the abdomen  or pelvis. Reproductive: Prostate and seminal vesicles normal in size and contour. Minimal prostatic calcification noted. Other: Appendix appears normal. No abscess or ascites evident in the abdomen or pelvis. There is mesenteric soft tissue thickening in the left lower quadrant without associated fluid. There is no evidence of bowel inflammation in this area. More subtly, there is mild mesenteric thickening in the mid abdominal mesentery. Musculoskeletal: Foci of degenerative change noted in the lumbar spine. No blastic or lytic bone lesions. No intramuscular lesions are evident. IMPRESSION: 1. There is a 4 x 3 mm calculus in the proximal left ureter causing mild left-sided hydronephrosis. 2. Intrarenal calculi noted bilaterally, with largest intrarenal calculus on the left. 3. Mildly enlarged lymph node posterior to the distal thoracic aorta. Etiology for this prominent lymph node uncertain. Given history of testicular carcinoma, correlation with nuclear medicine PET study may be a reasonable consideration. No other adenopathy evident. 4. Mesenteric thickening in the left lower quadrant without associated fluid. No bowel involvement.  Etiology for this finding uncertain. This finding may be due to reactive etiology secondary to the ureteral calculus on the left. 5. Question mild mesenteric adenitis in the mid abdominal mesenteric region. 6.  Hepatic steatosis. 7. 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 This diverticulum extends to the origin of a right inguinal hernia. There is no bladder compromise. There is no evident bowel extending into this inguinal hernia on the right. 8.  Aortic Atherosclerosis (ICD10-I70.0). Electronically Signed   By: Lowella Grip III M.D.   On: 05/23/2020 12:49    Procedures Procedures   Medications Ordered in ED Medications  sodium chloride 0.9 % bolus 1,000 mL (0 mLs Intravenous Stopped 05/23/20 1422)  morphine 4 MG/ML injection 4 mg (4 mg Intravenous Given 05/23/20 1209)  ondansetron (ZOFRAN) injection 4 mg (4 mg Intravenous Given 05/23/20 1209)    ED Course  I have reviewed the triage vital signs and the nursing notes.  Pertinent labs & imaging results that were available during my care of the patient were reviewed by me and considered in my medical decision making (see chart for details).    MDM Rules/Calculators/A&P                          65yo male with history of DM, htn, hlpd, sleep apnea, testicular cancer, nephrolithiasis, presents with concern for left flank pain. DDx includes pyelonephritis, nephrolithiasis, AAA, dissection. No epigastric or abdominal pain and doubt appendicitis, pancreatitis, cholecystitis, diverticulitis. Normal bilateral LE pulses.   Labs show mild leukocytosis without any other significant abnormalities.  Urinalysis shows no evidence of infection.  CT stone study shows a 4 x 3 mm calculus in the proximal left ureter causing mild left-sided hydronephrosis.  Feel this is likely etiology of his flank pain.  Discussed also incidental findings including mildly enlarged lymph node posterior to the distal thoracic aorta,  recommend follow-up with primary care physician to discuss further imaging or referral, as well as follow-up with urology for this finding and nephrolithiasis and bladder diverticulum.  He has mesenteric thickening of unclear etiology.  Recommend follow-up with PCP regarding these findings.  Reviewed in [place] drug database, discussed risks of narcotic medications, prescribed medication for pain.  Recommend taking ibuprofen primarily, Flomax, and staying hydrated. Patient discharged in stable condition with understanding of reasons to return.    Final Clinical Impression(s) / ED Diagnoses Final diagnoses:  Nephrolithiasis  Enlarged lymph  node    Rx / DC Orders ED Discharge Orders         Ordered    HYDROcodone-acetaminophen (NORCO/VICODIN) 5-325 MG tablet  Every 4 hours PRN        05/23/20 1401    ondansetron (ZOFRAN) 4 MG tablet  Every 6 hours        05/23/20 1401    tamsulosin (FLOMAX) 0.4 MG CAPS capsule  Daily after supper        05/23/20 1401           Gareth Morgan, MD 05/23/20 1548

## 2020-05-23 NOTE — ED Notes (Addendum)
Left flank pain since Sunday - one episode of vomiting (3 in a row) yesterday. Decreased appetite. Took one advil yesterday - no meds today    Denies any urinary issues

## 2020-05-23 NOTE — ED Notes (Signed)
Back from CT

## 2020-05-23 NOTE — ED Notes (Signed)
Pt to CT  Pt advised that urine is needed

## 2020-05-23 NOTE — ED Notes (Signed)
Pt discharged home after verbalizing understanding of discharge instructions; nad noted. 

## 2020-05-23 NOTE — ED Triage Notes (Signed)
Pt via pov from home with left flank pain since Sunday. Pt states he has hx of kidney stones, and this feels similar. Pt endorses nausea/vomiting yesterday. Pt alert & oriented, nad noted.

## 2020-05-25 DIAGNOSIS — N202 Calculus of kidney with calculus of ureter: Secondary | ICD-10-CM | POA: Diagnosis not present

## 2020-05-25 DIAGNOSIS — Z8547 Personal history of malignant neoplasm of testis: Secondary | ICD-10-CM | POA: Diagnosis not present

## 2020-05-27 LAB — URINE CULTURE: Culture: NO GROWTH

## 2020-05-31 ENCOUNTER — Telehealth: Payer: Self-pay | Admitting: Oncology

## 2020-05-31 NOTE — Telephone Encounter (Signed)
Received a new pt referral from Dr. Junious Silk at [facility] for hx of testicular cancer with a 1.4cm thoracic lymph node. Mr. Omar Owens has been cld and scheduled to see Dr. Alen Blew on 3/25 at 11am. Pt aware to arrive 20 minutes early.

## 2020-06-01 DIAGNOSIS — N200 Calculus of kidney: Secondary | ICD-10-CM | POA: Diagnosis not present

## 2020-06-09 ENCOUNTER — Inpatient Hospital Stay: Payer: BC Managed Care – PPO | Attending: Oncology | Admitting: Oncology

## 2020-06-09 ENCOUNTER — Other Ambulatory Visit: Payer: Self-pay

## 2020-06-09 VITALS — BP 120/75 | HR 80 | Temp 97.6°F | Resp 18 | Ht 66.5 in | Wt 248.0 lb

## 2020-06-09 DIAGNOSIS — R591 Generalized enlarged lymph nodes: Secondary | ICD-10-CM | POA: Insufficient documentation

## 2020-06-09 DIAGNOSIS — I10 Essential (primary) hypertension: Secondary | ICD-10-CM | POA: Insufficient documentation

## 2020-06-09 DIAGNOSIS — G4730 Sleep apnea, unspecified: Secondary | ICD-10-CM | POA: Insufficient documentation

## 2020-06-09 DIAGNOSIS — Z7984 Long term (current) use of oral hypoglycemic drugs: Secondary | ICD-10-CM | POA: Insufficient documentation

## 2020-06-09 DIAGNOSIS — Z905 Acquired absence of kidney: Secondary | ICD-10-CM | POA: Insufficient documentation

## 2020-06-09 DIAGNOSIS — Z79899 Other long term (current) drug therapy: Secondary | ICD-10-CM | POA: Insufficient documentation

## 2020-06-09 DIAGNOSIS — Z8547 Personal history of malignant neoplasm of testis: Secondary | ICD-10-CM | POA: Insufficient documentation

## 2020-06-09 DIAGNOSIS — Z9079 Acquired absence of other genital organ(s): Secondary | ICD-10-CM | POA: Insufficient documentation

## 2020-06-09 DIAGNOSIS — R918 Other nonspecific abnormal finding of lung field: Secondary | ICD-10-CM | POA: Insufficient documentation

## 2020-06-09 DIAGNOSIS — E785 Hyperlipidemia, unspecified: Secondary | ICD-10-CM | POA: Insufficient documentation

## 2020-06-09 DIAGNOSIS — R911 Solitary pulmonary nodule: Secondary | ICD-10-CM

## 2020-06-09 DIAGNOSIS — E119 Type 2 diabetes mellitus without complications: Secondary | ICD-10-CM | POA: Insufficient documentation

## 2020-06-09 NOTE — Progress Notes (Signed)
Reason for the request:   Thoracic lymphadenopathy  HPI: I was asked by Dr. Junious Silk to evaluate Omar Owens for abnormal CT scan with thoracic lymph nodes.  He is a 65 year old man with a history of a left testicular cancer diagnosed in 55.  At that time he had a left radical nephrectomy performed by Dr. Terance Owens and did not receive any additional therapy and remained on active surveillance for a period of time.  He developed nephrolithiasis and presented on March 8 of 2022 with flank pain and presumably another kidney stone.  CT scan obtained on May 23, 2020 including imaging of the abdomen and pelvis to evaluate for his nephrolithiasis found a 4 x 3 mm calculus in the proximal left ureter causing left-sided hydronephrosis.  Incidentally, he was found to have a mildly enlarged lymph node in the posterior distal thoracic aorta without any other adenopathy.  Given his history of malignancy is referred to me for evaluation.  Clinically, he reports no complaints at this time.  He denies any nausea, vomiting or abdominal pain.  Denies any constitutional symptoms.  He does not report any headaches, blurry vision, syncope or seizures. Does not report any fevers, chills or sweats.  Does not report any cough, wheezing or hemoptysis.  Does not report any chest pain, palpitation, orthopnea or leg edema.  Does not report any nausea, vomiting or abdominal pain.  Does not report any constipation or diarrhea.  Does not report any skeletal complaints.    Does not report frequency, urgency or hematuria.  Does not report any skin rashes or lesions. Does not report any heat or cold intolerance.  Does not report any lymphadenopathy or petechiae.  Does not report any anxiety or depression.  Remaining review of systems is negative.    Past Medical History:  Diagnosis Date  . Diabetes mellitus without complication ([place])   . Hyperlipidemia   . Hypertension   . Sleep apnea   . Testicular cancer [facility])    1986 testicular   :  Past Surgical History:  Procedure Laterality Date  . RADICAL ORCHIECTOMY     L, r/t CA  :   Current Outpatient Medications:  .  Blood Glucose Monitoring Suppl (TRUE METRIX AIR GLUCOSE METER) w/Device KIT, See admin instructions., Disp: , Rfl:  .  lisinopril (ZESTRIL) 20 MG tablet, Take 20 mg by mouth daily., Disp: , Rfl:  .  metFORMIN (GLUCOPHAGE) 500 MG tablet, Take 500 mg by mouth at bedtime., Disp: , Rfl: :  Allergies  Allergen Reactions  . Penicillins Hives  :  No family history on file.:  Social History   Socioeconomic History  . Marital status: Married    Spouse name: Not on file  . Number of children: Not on file  . Years of education: Not on file  . Highest education level: Not on file  Occupational History  . Not on file  Tobacco Use  . Smoking status: Never Smoker  . Smokeless tobacco: Never Used  Vaping Use  . Vaping Use: Never used  Substance and Sexual Activity  . Alcohol use: Yes    Comment: occasionally  . Drug use: No  . Sexual activity: Not on file  Other Topics Concern  . Not on file  Social History Narrative  . Not on file   Social Determinants of Health   Financial Resource Strain: Not on file  Food Insecurity: Not on file  Transportation Needs: Not on file  Physical Activity: Not on file  Stress:  Not on file  Social Connections: Not on file  Intimate Partner Violence: Not on file  :  Pertinent items are noted in HPI.  Exam: Blood pressure 120/75, pulse 80, temperature 97.6 F (36.4 C), temperature source Tympanic, resp. rate 18, height 5' 6.5" (1.689 m), weight 248 lb (112.5 kg), SpO2 100 %.  ECOG is 0 General appearance: alert and cooperative appeared without distress. Head: atraumatic without any abnormalities. Eyes: conjunctivae/corneas clear. PERRL.  Sclera anicteric. Throat: lips, mucosa, and tongue normal; without oral thrush or ulcers. Resp: clear to auscultation bilaterally without rhonchi, wheezes or dullness to  percussion. Cardio: regular rate and rhythm, S1, S2 normal, no murmur, click, rub or gallop GI: soft, non-tender; bowel sounds normal; no masses,  no organomegaly Skin: Skin color, texture, turgor normal. No rashes or lesions Lymph nodes: Cervical, supraclavicular, and axillary nodes normal. Neurologic: Grossly normal without any motor, sensory or deep tendon reflexes. Musculoskeletal: No joint deformity or effusion.    CT Renal Stone Study  Result Date: 05/23/2020 CLINICAL DATA:  Left flank pain with nausea and vomiting. History of testicular carcinoma EXAM: CT ABDOMEN AND PELVIS WITHOUT CONTRAST TECHNIQUE: Multidetector CT imaging of the abdomen and pelvis was performed following the standard protocol without oral or IV contrast. COMPARISON:  March 16, 2002 FINDINGS: Lower chest: Visualized lung bases clear. Hepatobiliary: Superior most aspect of the liver not imaged. There is hepatic steatosis throughout visualized liver. No focal liver lesions evident on noncontrast enhanced study. Gallbladder wall is not appreciably thickened. There is no biliary duct dilatation. Pancreas: There is no pancreatic mass or inflammatory focus. Spleen: No splenic lesions evident. Adrenals/Urinary Tract: Adrenals bilaterally appear normal. There is a cyst arising from the posterior aspect of the left kidney measuring 1.1 x 1.0 cm. There is a cyst arising from the right kidney medially measuring 2.7 by 2.4 cm. There is mild hydronephrosis on the left. There is no appreciable hydronephrosis on the right. On the left, there is a calculus in the upper pole region measuring 7 x 6 mm. There is a nearby 1 mm calculus in the upper pole left kidney region. There is a 2 mm calculus in the anterior lower pole right kidney. There is also a 1 mm calculus in the mid right kidney. There is a calculus in the proximal left ureter at the L3-4 level measuring 4 x 3 mm. No other ureteral calculi are evident. The urinary bladder wall is not  thickened. There is a diverticulum arising from the anterior inferior aspect of the bladder which extends to the origin of a right inguinal hernia but is not entrapped within hernia. No bladder compromise evident. Stomach/Bowel: There is no appreciable bowel wall thickening. No evident bowel obstruction. The terminal ileum appears normal. There is no evident free air or portal venous air. Vascular/Lymphatic: There are foci of aortic and iliac atherosclerosis. No evident abdominal aortic aneurysm. There is a mildly prominent lymph node inferior to the aorta in the distal thoracic region measuring 1.4 x 1.1 cm, seen on axial slice 11 series 2. No retroperitoneal adenopathy evident. No adenopathy elsewhere in the abdomen or pelvis. Reproductive: Prostate and seminal vesicles normal in size and contour. Minimal prostatic calcification noted. Other: Appendix appears normal. No abscess or ascites evident in the abdomen or pelvis. There is mesenteric soft tissue thickening in the left lower quadrant without associated fluid. There is no evidence of bowel inflammation in this area. More subtly, there is mild mesenteric thickening in the mid abdominal mesentery. Musculoskeletal:   Foci of degenerative change noted in the lumbar spine. No blastic or lytic bone lesions. No intramuscular lesions are evident. IMPRESSION: 1. There is a 4 x 3 mm calculus in the proximal left ureter causing mild left-sided hydronephrosis. 2. Intrarenal calculi noted bilaterally, with largest intrarenal calculus on the left. 3. Mildly enlarged lymph node posterior to the distal thoracic aorta. Etiology for this prominent lymph node uncertain. Given history of testicular carcinoma, correlation with nuclear medicine PET study may be a reasonable consideration. No other adenopathy evident. 4. Mesenteric thickening in the left lower quadrant without associated fluid. No bowel involvement. Etiology for this finding uncertain. This finding may be due to  reactive etiology secondary to the ureteral calculus on the left. 5. Question mild mesenteric adenitis in the mid abdominal mesenteric region. 6.  Hepatic steatosis. 7. 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 This diverticulum extends to the origin of a right inguinal hernia. There is no bladder compromise. There is no evident bowel extending into this inguinal hernia on the right. 8.  Aortic Atherosclerosis (ICD10-I70.0). Electronically Signed   By: Daeshawn  Woodruff III M.D.   On: 05/23/2020 12:49    Assessment and Plan:    64-year-old with:  1.  1.4 x 1.1 cm thoracic region lymph node noted on incidental imaging study on May 23, 2020.  He has no evidence of any other lymphadenopathy with history of testicular cancer.  The differential diagnosis of these findings were discussed at this time.  Benign etiologies including reactive lymphadenopathy, sarcoidosis, metastatic malignancy among others were reviewed.  It is unlikely that he has testicular cancer that is spread into thoracic lymph node after over 30 years diagnosis.  However another malignancy could be a possibility at this time.  From a management standpoint, I recommend obtaining a PET CT scan to evaluate for possible thoracic malignancy such as a lung neoplasm.  Upon obtaining these imaging studies were recommended tissue biopsy accordingly.  Treatment options will be discussed once the final diagnosis is made.   2.  Testicular cancer: Status post left orchiectomy with the exact pathology is not available to me.  It is unlikely for his cancer to recur but is not impossible especially if it seminoma.  We will evaluate him specifically with a PET scan.  3.  Follow-up: Will be determined pending his imaging studies.   60  minutes were dedicated to this visit. The time was spent on reviewing imaging studies, discussing treatment options, discussing differential diagnosis and answering  questions regarding future plan.    A copy of this consult has been forwarded to the requesting physician.  

## 2020-06-15 ENCOUNTER — Other Ambulatory Visit: Payer: Self-pay | Admitting: Urology

## 2020-06-15 DIAGNOSIS — N201 Calculus of ureter: Secondary | ICD-10-CM

## 2020-06-22 ENCOUNTER — Other Ambulatory Visit (HOSPITAL_COMMUNITY)
Admission: RE | Admit: 2020-06-22 | Discharge: 2020-06-22 | Disposition: A | Discharge status: Home / Self Care | Payer: BC Managed Care – PPO | Source: Ambulatory Visit | Attending: Urology | Admitting: Urology

## 2020-06-22 DIAGNOSIS — Z01812 Encounter for preprocedural laboratory examination: Secondary | ICD-10-CM | POA: Diagnosis not present

## 2020-06-22 DIAGNOSIS — Z20822 Contact with and (suspected) exposure to covid-19: Secondary | ICD-10-CM | POA: Insufficient documentation

## 2020-06-22 LAB — SARS CORONAVIRUS 2 (TAT 6-24 HRS): SARS Coronavirus 2: NEGATIVE

## 2020-06-23 NOTE — Progress Notes (Signed)
Talked with patient. arrival time 0600. Cl liquids until 0330 okay to take am meds with sip of water. States no blood thinners. Wife is the driver. covid test completed

## 2020-06-26 ENCOUNTER — Ambulatory Visit (HOSPITAL_COMMUNITY): Payer: BC Managed Care – PPO

## 2020-06-26 ENCOUNTER — Encounter (HOSPITAL_BASED_OUTPATIENT_CLINIC_OR_DEPARTMENT_OTHER)
Admission: RE | Disposition: A | Discharge status: Home / Self Care | Payer: Self-pay | Source: Home / Self Care | Attending: Urology

## 2020-06-26 ENCOUNTER — Encounter (HOSPITAL_BASED_OUTPATIENT_CLINIC_OR_DEPARTMENT_OTHER): Payer: Self-pay | Admitting: Urology

## 2020-06-26 ENCOUNTER — Ambulatory Visit (HOSPITAL_BASED_OUTPATIENT_CLINIC_OR_DEPARTMENT_OTHER)
Admission: RE | Admit: 2020-06-26 | Discharge: 2020-06-26 | Disposition: A | Discharge status: Home / Self Care | Payer: BC Managed Care – PPO | Attending: Urology | Admitting: Urology

## 2020-06-26 ENCOUNTER — Other Ambulatory Visit: Payer: Self-pay

## 2020-06-26 DIAGNOSIS — N200 Calculus of kidney: Secondary | ICD-10-CM | POA: Diagnosis not present

## 2020-06-26 DIAGNOSIS — I10 Essential (primary) hypertension: Secondary | ICD-10-CM | POA: Insufficient documentation

## 2020-06-26 DIAGNOSIS — E119 Type 2 diabetes mellitus without complications: Secondary | ICD-10-CM | POA: Diagnosis not present

## 2020-06-26 DIAGNOSIS — N132 Hydronephrosis with renal and ureteral calculous obstruction: Secondary | ICD-10-CM | POA: Insufficient documentation

## 2020-06-26 DIAGNOSIS — Z8547 Personal history of malignant neoplasm of testis: Secondary | ICD-10-CM | POA: Diagnosis not present

## 2020-06-26 DIAGNOSIS — I878 Other specified disorders of veins: Secondary | ICD-10-CM | POA: Diagnosis not present

## 2020-06-26 DIAGNOSIS — Z880 Allergy status to penicillin: Secondary | ICD-10-CM | POA: Diagnosis not present

## 2020-06-26 DIAGNOSIS — G4730 Sleep apnea, unspecified: Secondary | ICD-10-CM | POA: Diagnosis not present

## 2020-06-26 DIAGNOSIS — Z01818 Encounter for other preprocedural examination: Secondary | ICD-10-CM | POA: Diagnosis not present

## 2020-06-26 DIAGNOSIS — N201 Calculus of ureter: Secondary | ICD-10-CM

## 2020-06-26 DIAGNOSIS — Z9079 Acquired absence of other genital organ(s): Secondary | ICD-10-CM | POA: Diagnosis not present

## 2020-06-26 HISTORY — PX: EXTRACORPOREAL SHOCK WAVE LITHOTRIPSY: SHX1557

## 2020-06-26 LAB — GLUCOSE, CAPILLARY: Glucose-Capillary: 135 mg/dL — ABNORMAL HIGH (ref 70–99)

## 2020-06-26 SURGERY — LITHOTRIPSY, ESWL
Anesthesia: LOCAL | Laterality: Left

## 2020-06-26 MED ORDER — DIPHENHYDRAMINE HCL 25 MG PO CAPS
25.0000 mg | ORAL_CAPSULE | ORAL | Status: AC
  Administered 2020-06-26: 25 mg via ORAL

## 2020-06-26 MED ORDER — TAMSULOSIN HCL 0.4 MG PO CAPS: 0.4000 mg | ORAL_CAPSULE | Freq: Every day | ORAL | 0 refills | Status: AC

## 2020-06-26 MED ORDER — CIPROFLOXACIN HCL 500 MG PO TABS
ORAL_TABLET | ORAL | Status: AC
  Filled 2020-06-26: qty 1

## 2020-06-26 MED ORDER — DIPHENHYDRAMINE HCL 25 MG PO CAPS
ORAL_CAPSULE | ORAL | Status: AC
  Filled 2020-06-26: qty 1

## 2020-06-26 MED ORDER — OXYCODONE-ACETAMINOPHEN 5-325 MG PO TABS: 1.0000 | ORAL_TABLET | Freq: Four times a day (QID) | ORAL | 0 refills | Status: AC | PRN

## 2020-06-26 MED ORDER — ONDANSETRON 8 MG PO TBDP: 8.0000 mg | ORAL_TABLET | Freq: Three times a day (TID) | ORAL | 0 refills | Status: AC | PRN

## 2020-06-26 MED ORDER — CIPROFLOXACIN HCL 500 MG PO TABS
500.0000 mg | ORAL_TABLET | ORAL | Status: AC
  Administered 2020-06-26: 500 mg via ORAL

## 2020-06-26 MED ORDER — SENNOSIDES-DOCUSATE SODIUM 8.6-50 MG PO TABS: 1.0000 | ORAL_TABLET | Freq: Two times a day (BID) | ORAL | 0 refills | Status: AC

## 2020-06-26 MED ORDER — DIAZEPAM 5 MG PO TABS
ORAL_TABLET | ORAL | Status: AC
  Filled 2020-06-26: qty 2

## 2020-06-26 MED ORDER — DIAZEPAM 5 MG PO TABS
10.0000 mg | ORAL_TABLET | ORAL | Status: AC
  Administered 2020-06-26: 10 mg via ORAL

## 2020-06-26 MED ORDER — SODIUM CHLORIDE 0.9 % IV SOLN: INTRAVENOUS | Status: DC

## 2020-06-26 NOTE — Discharge Instructions (Signed)
1 - You may have urinary urgency (bladder spasms), pass small stone fragments, and bloody urine on / off for up to 2 weeks. This is normal. ° °2 - Call MD or go to ER for fever >102, severe pain / nausea / vomiting not relieved by medications, or acute change in medical status ° °

## 2020-06-26 NOTE — H&P (Signed)
Omar Owens is an 65 y.o. male.    Chief Complaint: Pre-Op LEFT Shockwave LIthotripsy  HPI:   1-  LEFT Ureteral / Renal Stone - 60mm left prox ureteral stone by ER CT 05/2020 on eval flank pain. At L4 TP area. UCX negative, Cr 1.01. Also LUP 22mm stone.  Today " Omar Owens" is seen to proceed with LEFT shockwave lithotripsy for left proimal ureteral stone that has not passed with several weeks of medical therapy. No interval fevers. C19 screen negative.  KUB today with some antegrade progression to upper SI joint area.   Past Medical History:  Diagnosis Date  . Diabetes mellitus without complication ([place])   . Hyperlipidemia   . Hypertension   . Sleep apnea   . Testicular cancer [facility])    1986 testicular    Past Surgical History:  Procedure Laterality Date  . RADICAL ORCHIECTOMY     L, r/t [place]    No family history on file. Social History:  reports that he has never smoked. He has never used smokeless tobacco. He reports current alcohol use. He reports that he does not use drugs.  Allergies:  Allergies  Allergen Reactions  . Penicillins Hives    No medications prior to admission.    No results found for this or any previous visit (from the past 48 hour(s)). No results found.  Review of Systems  Constitutional: Negative for chills and fever.  Genitourinary: Positive for flank pain.  All other systems reviewed and are negative.   There were no vitals taken for this visit. Physical Exam Vitals reviewed.  HENT:     Head: Normocephalic.     Nose: Nose normal.  Eyes:     Pupils: Pupils are equal, round, and reactive to light.  Cardiovascular:     Rate and Rhythm: Normal rate.     Pulses: Normal pulses.  Pulmonary:     Effort: Pulmonary effort is normal.  Abdominal:     General: Abdomen is flat.     Comments: priro scars w/o hernias.   Genitourinary:    Comments: miled LEFT CVAT Musculoskeletal:     Cervical back: Normal range of motion.  Skin:    General: Skin  is warm.  Neurological:     General: No focal deficit present.     Mental Status: He is alert.  Psychiatric:        Mood and Affect: Mood normal.      Assessment/Plan  1-  LEFT Ureteral / Renal Stone - proceed as planned with LEFT shockwave lithotripsy. Risks, benefits, alternatives, expected peri-treatment course discussed previously and reiterated today.   Alexis Frock, MD 06/26/2020, 5:16 AM

## 2020-06-26 NOTE — Brief Op Note (Signed)
06/26/2020  8:26 AM  PATIENT:  Omar Owens  65 y.o. male  PRE-OPERATIVE DIAGNOSIS:  LEFT URETERAL CALCULI  POST-OPERATIVE DIAGNOSIS:  * No post-op diagnosis entered *  PROCEDURE:  Procedure(s): EXTRACORPOREAL SHOCK WAVE LITHOTRIPSY (ESWL) (Left)  SURGEON:  Surgeon(s) and Role:    * Alexis Frock, MD - Primary  PHYSICIAN ASSISTANT:   ASSISTANTS: none   ANESTHESIA:   MAC  EBL:  Minimal    BLOOD ADMINISTERED:none  DRAINS: none   LOCAL MEDICATIONS USED:  NONE  SPECIMEN:  No Specimen  DISPOSITION OF SPECIMEN:  N/A  COUNTS:  YES  TOURNIQUET:  * No tourniquets in log *  DICTATION: .Note written in paper chart  PLAN OF CARE: Discharge to home after PACU  PATIENT DISPOSITION:  PACU - hemodynamically stable.   Delay start of Pharmacological VTE agent (>24hrs) due to surgical blood loss or risk of bleeding: not applicable

## 2020-06-27 ENCOUNTER — Encounter (HOSPITAL_BASED_OUTPATIENT_CLINIC_OR_DEPARTMENT_OTHER): Payer: Self-pay | Admitting: Urology

## 2020-07-03 DIAGNOSIS — H43812 Vitreous degeneration, left eye: Secondary | ICD-10-CM | POA: Diagnosis not present

## 2020-07-06 ENCOUNTER — Other Ambulatory Visit: Payer: Self-pay

## 2020-07-06 ENCOUNTER — Other Ambulatory Visit: Payer: Self-pay | Admitting: Oncology

## 2020-07-06 ENCOUNTER — Ambulatory Visit (HOSPITAL_COMMUNITY)
Admission: RE | Admit: 2020-07-06 | Discharge: 2020-07-06 | Disposition: A | Discharge status: Home / Self Care | Payer: BC Managed Care – PPO | Source: Ambulatory Visit | Attending: Oncology | Admitting: Oncology

## 2020-07-06 DIAGNOSIS — R911 Solitary pulmonary nodule: Secondary | ICD-10-CM | POA: Diagnosis not present

## 2020-07-06 LAB — GLUCOSE, CAPILLARY: Glucose-Capillary: 126 mg/dL — ABNORMAL HIGH (ref 70–99)

## 2020-07-06 MED ORDER — FLUDEOXYGLUCOSE F - 18 (FDG) INJECTION
12.0000 | Freq: Once | INTRAVENOUS | Status: AC
  Administered 2020-07-06: 12 via INTRAVENOUS

## 2020-07-06 NOTE — Progress Notes (Signed)
The results of the PET scan were personally reviewed and discussed with the patient via phone today.  See no evidence to suggest malignancy at this time these findings appear to be benign.  No further oncology will work-up is needed.  I am happy to see him in the future when needed.

## 2020-07-13 DIAGNOSIS — D122 Benign neoplasm of ascending colon: Secondary | ICD-10-CM | POA: Diagnosis not present

## 2020-07-13 DIAGNOSIS — Z86010 Personal history of colonic polyps: Secondary | ICD-10-CM | POA: Diagnosis not present

## 2020-07-17 DIAGNOSIS — N201 Calculus of ureter: Secondary | ICD-10-CM | POA: Diagnosis not present

## 2020-07-18 DIAGNOSIS — H43812 Vitreous degeneration, left eye: Secondary | ICD-10-CM | POA: Diagnosis not present

## 2020-07-24 DIAGNOSIS — E1169 Type 2 diabetes mellitus with other specified complication: Secondary | ICD-10-CM | POA: Diagnosis not present

## 2020-07-24 DIAGNOSIS — I10 Essential (primary) hypertension: Secondary | ICD-10-CM | POA: Diagnosis not present

## 2020-07-24 DIAGNOSIS — E7800 Pure hypercholesterolemia, unspecified: Secondary | ICD-10-CM | POA: Diagnosis not present

## 2020-08-08 DIAGNOSIS — N201 Calculus of ureter: Secondary | ICD-10-CM | POA: Diagnosis not present

## 2020-09-01 DIAGNOSIS — M25551 Pain in right hip: Secondary | ICD-10-CM | POA: Diagnosis not present

## 2020-09-07 DIAGNOSIS — M1611 Unilateral primary osteoarthritis, right hip: Secondary | ICD-10-CM | POA: Diagnosis not present

## 2020-09-21 DIAGNOSIS — N202 Calculus of kidney with calculus of ureter: Secondary | ICD-10-CM | POA: Diagnosis not present

## 2020-10-23 DIAGNOSIS — H26492 Other secondary cataract, left eye: Secondary | ICD-10-CM | POA: Diagnosis not present

## 2020-10-23 DIAGNOSIS — H40013 Open angle with borderline findings, low risk, bilateral: Secondary | ICD-10-CM | POA: Diagnosis not present

## 2020-10-23 DIAGNOSIS — H43813 Vitreous degeneration, bilateral: Secondary | ICD-10-CM | POA: Diagnosis not present

## 2020-10-23 DIAGNOSIS — E119 Type 2 diabetes mellitus without complications: Secondary | ICD-10-CM | POA: Diagnosis not present

## 2020-12-20 DIAGNOSIS — G4733 Obstructive sleep apnea (adult) (pediatric): Secondary | ICD-10-CM | POA: Diagnosis not present

## 2021-02-01 DIAGNOSIS — E7800 Pure hypercholesterolemia, unspecified: Secondary | ICD-10-CM | POA: Diagnosis not present

## 2021-02-01 DIAGNOSIS — E1169 Type 2 diabetes mellitus with other specified complication: Secondary | ICD-10-CM | POA: Diagnosis not present

## 2021-02-01 DIAGNOSIS — I10 Essential (primary) hypertension: Secondary | ICD-10-CM | POA: Diagnosis not present

## 2021-02-01 DIAGNOSIS — Z125 Encounter for screening for malignant neoplasm of prostate: Secondary | ICD-10-CM | POA: Diagnosis not present

## 2021-02-01 DIAGNOSIS — Z0000 Encounter for general adult medical examination without abnormal findings: Secondary | ICD-10-CM | POA: Diagnosis not present

## 2021-02-01 DIAGNOSIS — E669 Obesity, unspecified: Secondary | ICD-10-CM | POA: Diagnosis not present

## 2021-04-24 DIAGNOSIS — M25561 Pain in right knee: Secondary | ICD-10-CM | POA: Diagnosis not present

## 2021-04-25 DIAGNOSIS — H40013 Open angle with borderline findings, low risk, bilateral: Secondary | ICD-10-CM | POA: Diagnosis not present

## 2021-08-01 DIAGNOSIS — G4733 Obstructive sleep apnea (adult) (pediatric): Secondary | ICD-10-CM | POA: Diagnosis not present

## 2021-08-01 DIAGNOSIS — E7800 Pure hypercholesterolemia, unspecified: Secondary | ICD-10-CM | POA: Diagnosis not present

## 2021-08-01 DIAGNOSIS — E1169 Type 2 diabetes mellitus with other specified complication: Secondary | ICD-10-CM | POA: Diagnosis not present

## 2021-08-01 DIAGNOSIS — I10 Essential (primary) hypertension: Secondary | ICD-10-CM | POA: Diagnosis not present

## 2021-11-24 IMAGING — CT NM PET TUM IMG INITIAL (PI) SKULL BASE T - THIGH
1 of 7 series · 1 of 25 positions shown · non-contrast
Comparison: CT scan 05/23/2020

CLINICAL DATA: Initial treatment strategy for lung nodule. Lower
thoracic aortic lymph node enlargement.

EXAM:
NUCLEAR MEDICINE PET SKULL BASE TO THIGH
TECHNIQUE: 12.0 mCi F-18 FDG was injected intravenously. Full-ring PET imaging
was performed from the skull base to thigh after the radiotracer. CT
data was obtained and used for attenuation correction and anatomic
localization.
Fasting blood glucose: 126 mg/dl

[Series 4: ct sk_thigh 5.0 bf37 · axial · 5.0mm · 0.98mm/px · 1 of 241 slices shown]
[im 241/241  brain]
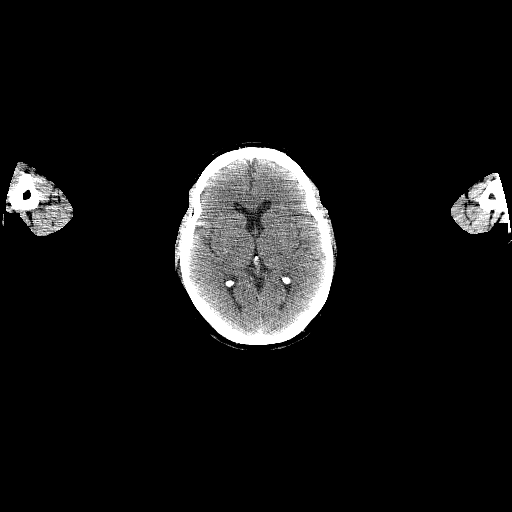

[1 of 25 positions shown; findings below may reference images not displayed]

FINDINGS: Mediastinal blood pool activity: SUV max

Liver activity: SUV max NA

NECK: No significant abnormal hypermetabolic activity in this
region.

Incidental CT findings: none

CHEST: A lower thoracic lymph node adjacent to the aorta measures
1.0 cm in short axis on image 100 of series 4 (formerly 1.1 cm on
05/23/2020) with maximum SUV of 1.7, below blood pool.

There is a suggestion of a 0.3 by 0.4 cm right upper lobe nodule on
image 19 of series 8, no corresponding accentuated metabolic
activity although this lesion is below sensitive PET-CT size
thresholds.

Likely physiologic activity in the distal esophagus, maximum SUV
3.8.

Incidental CT findings: Aortic atherosclerosis.

ABDOMEN/PELVIS: No significant abnormal hypermetabolic activity in
this region.

Incidental CT findings: Diffuse hepatic steatosis.

Bilateral nonobstructive nephrolithiasis. There is also a 4 mm
calculus in the left ureter just proximal to the iliac vessel cross
over on image 158 of series 4, associated with mild left
hydronephrosis. This appears to have migrated distally about 7 cm
compared to the 05/23/2020 exam.

Small photopenic left kidney upper pole exophytic cyst. Right kidney
lower pole parapelvic cyst. Is small direct right inguinal hernia
contains adipose tissue and a small amount of the urinary bladder.
Nonvisualization of the left spermatic cord and testicle, query
orchectomy.

Aortoiliac atherosclerotic vascular disease.

SKELETON: No significant abnormal hypermetabolic activity in this
region.

Incidental CT findings: Degenerative hip arthropathy bilaterally.
IMPRESSION: 1. The lower thoracic periaortic lymph node is borderline prominent
at 1.0 cm in short axis (previously 1.1 cm) but has a maximum SUV of
1.7, well below the blood pool level. This would tend to favor a
benign etiology. The 4 mm calculus in left ureter has migrated
distally by about 7 cm compared to the CT scan of 05/23/2020 and is
currently just proximal to the iliac vessel cross over. Borderline
left hydronephrosis.
2. Additional bilateral nonobstructive nephrolithiasis.
3. Diffuse hepatic steatosis.
4. 3 by 4 mm right upper lobe nodule is not hypermetabolic but is
below sensitive PET-CT size thresholds. If clinically warranted this
could be surveilled with chest CT in 1 years time.
5.  Aortic Atherosclerosis ([id number]).
6. Degenerative hip arthropathy bilaterally.

## 2022-01-16 DIAGNOSIS — H40013 Open angle with borderline findings, low risk, bilateral: Secondary | ICD-10-CM | POA: Diagnosis not present

## 2022-01-16 DIAGNOSIS — E119 Type 2 diabetes mellitus without complications: Secondary | ICD-10-CM | POA: Diagnosis not present

## 2022-01-16 DIAGNOSIS — H04123 Dry eye syndrome of bilateral lacrimal glands: Secondary | ICD-10-CM | POA: Diagnosis not present

## 2022-01-16 DIAGNOSIS — H26492 Other secondary cataract, left eye: Secondary | ICD-10-CM | POA: Diagnosis not present

## 2022-01-23 DIAGNOSIS — G4733 Obstructive sleep apnea (adult) (pediatric): Secondary | ICD-10-CM | POA: Diagnosis not present

## 2022-02-18 DIAGNOSIS — G4733 Obstructive sleep apnea (adult) (pediatric): Secondary | ICD-10-CM | POA: Diagnosis not present

## 2022-02-21 DIAGNOSIS — Z0000 Encounter for general adult medical examination without abnormal findings: Secondary | ICD-10-CM | POA: Diagnosis not present

## 2022-02-21 DIAGNOSIS — Z1331 Encounter for screening for depression: Secondary | ICD-10-CM | POA: Diagnosis not present

## 2022-02-21 DIAGNOSIS — E669 Obesity, unspecified: Secondary | ICD-10-CM | POA: Diagnosis not present

## 2022-02-21 DIAGNOSIS — E1169 Type 2 diabetes mellitus with other specified complication: Secondary | ICD-10-CM | POA: Diagnosis not present

## 2022-02-21 DIAGNOSIS — E7800 Pure hypercholesterolemia, unspecified: Secondary | ICD-10-CM | POA: Diagnosis not present

## 2022-02-21 DIAGNOSIS — Z125 Encounter for screening for malignant neoplasm of prostate: Secondary | ICD-10-CM | POA: Diagnosis not present

## 2022-02-21 DIAGNOSIS — I10 Essential (primary) hypertension: Secondary | ICD-10-CM | POA: Diagnosis not present

## 2022-02-25 DIAGNOSIS — G4733 Obstructive sleep apnea (adult) (pediatric): Secondary | ICD-10-CM | POA: Diagnosis not present

## 2022-03-21 DIAGNOSIS — G4733 Obstructive sleep apnea (adult) (pediatric): Secondary | ICD-10-CM | POA: Diagnosis not present

## 2022-04-21 DIAGNOSIS — G4733 Obstructive sleep apnea (adult) (pediatric): Secondary | ICD-10-CM | POA: Diagnosis not present

## 2022-05-07 DIAGNOSIS — G4733 Obstructive sleep apnea (adult) (pediatric): Secondary | ICD-10-CM | POA: Diagnosis not present

## 2022-05-22 DIAGNOSIS — M170 Bilateral primary osteoarthritis of knee: Secondary | ICD-10-CM | POA: Diagnosis not present

## 2022-05-30 DIAGNOSIS — E1169 Type 2 diabetes mellitus with other specified complication: Secondary | ICD-10-CM | POA: Diagnosis not present

## 2022-05-30 DIAGNOSIS — I10 Essential (primary) hypertension: Secondary | ICD-10-CM | POA: Diagnosis not present

## 2022-06-01 DIAGNOSIS — G4733 Obstructive sleep apnea (adult) (pediatric): Secondary | ICD-10-CM | POA: Diagnosis not present

## 2022-06-10 DIAGNOSIS — M1711 Unilateral primary osteoarthritis, right knee: Secondary | ICD-10-CM | POA: Diagnosis not present

## 2022-06-10 DIAGNOSIS — M25661 Stiffness of right knee, not elsewhere classified: Secondary | ICD-10-CM | POA: Diagnosis not present

## 2022-06-10 DIAGNOSIS — M25561 Pain in right knee: Secondary | ICD-10-CM | POA: Diagnosis not present

## 2022-06-25 DIAGNOSIS — H00012 Hordeolum externum right lower eyelid: Secondary | ICD-10-CM | POA: Diagnosis not present

## 2022-07-01 DIAGNOSIS — M1711 Unilateral primary osteoarthritis, right knee: Secondary | ICD-10-CM | POA: Diagnosis not present

## 2022-07-01 DIAGNOSIS — G8918 Other acute postprocedural pain: Secondary | ICD-10-CM | POA: Diagnosis not present

## 2022-07-03 DIAGNOSIS — M25661 Stiffness of right knee, not elsewhere classified: Secondary | ICD-10-CM | POA: Diagnosis not present

## 2022-07-03 DIAGNOSIS — M25561 Pain in right knee: Secondary | ICD-10-CM | POA: Diagnosis not present

## 2022-07-05 DIAGNOSIS — M25561 Pain in right knee: Secondary | ICD-10-CM | POA: Diagnosis not present

## 2022-07-05 DIAGNOSIS — M25661 Stiffness of right knee, not elsewhere classified: Secondary | ICD-10-CM | POA: Diagnosis not present

## 2022-07-08 DIAGNOSIS — M25561 Pain in right knee: Secondary | ICD-10-CM | POA: Diagnosis not present

## 2022-07-08 DIAGNOSIS — M25661 Stiffness of right knee, not elsewhere classified: Secondary | ICD-10-CM | POA: Diagnosis not present

## 2022-07-10 DIAGNOSIS — M25661 Stiffness of right knee, not elsewhere classified: Secondary | ICD-10-CM | POA: Diagnosis not present

## 2022-07-10 DIAGNOSIS — M25562 Pain in left knee: Secondary | ICD-10-CM | POA: Diagnosis not present

## 2022-07-12 DIAGNOSIS — M25561 Pain in right knee: Secondary | ICD-10-CM | POA: Diagnosis not present

## 2022-07-12 DIAGNOSIS — M25661 Stiffness of right knee, not elsewhere classified: Secondary | ICD-10-CM | POA: Diagnosis not present

## 2022-07-15 DIAGNOSIS — M25661 Stiffness of right knee, not elsewhere classified: Secondary | ICD-10-CM | POA: Diagnosis not present

## 2022-07-15 DIAGNOSIS — M25561 Pain in right knee: Secondary | ICD-10-CM | POA: Diagnosis not present

## 2022-07-17 DIAGNOSIS — M25561 Pain in right knee: Secondary | ICD-10-CM | POA: Diagnosis not present

## 2022-07-17 DIAGNOSIS — M25661 Stiffness of right knee, not elsewhere classified: Secondary | ICD-10-CM | POA: Diagnosis not present

## 2022-07-22 DIAGNOSIS — M25661 Stiffness of right knee, not elsewhere classified: Secondary | ICD-10-CM | POA: Diagnosis not present

## 2022-07-22 DIAGNOSIS — M25561 Pain in right knee: Secondary | ICD-10-CM | POA: Diagnosis not present

## 2022-07-24 DIAGNOSIS — M25661 Stiffness of right knee, not elsewhere classified: Secondary | ICD-10-CM | POA: Diagnosis not present

## 2022-07-29 DIAGNOSIS — M25661 Stiffness of right knee, not elsewhere classified: Secondary | ICD-10-CM | POA: Diagnosis not present

## 2022-07-31 DIAGNOSIS — M25661 Stiffness of right knee, not elsewhere classified: Secondary | ICD-10-CM | POA: Diagnosis not present

## 2022-07-31 DIAGNOSIS — M25561 Pain in right knee: Secondary | ICD-10-CM | POA: Diagnosis not present

## 2022-08-06 DIAGNOSIS — M25661 Stiffness of right knee, not elsewhere classified: Secondary | ICD-10-CM | POA: Diagnosis not present

## 2022-08-06 DIAGNOSIS — M25561 Pain in right knee: Secondary | ICD-10-CM | POA: Diagnosis not present

## 2022-08-14 DIAGNOSIS — Z96651 Presence of right artificial knee joint: Secondary | ICD-10-CM | POA: Diagnosis not present

## 2022-08-14 DIAGNOSIS — M25661 Stiffness of right knee, not elsewhere classified: Secondary | ICD-10-CM | POA: Diagnosis not present

## 2022-08-14 DIAGNOSIS — M25561 Pain in right knee: Secondary | ICD-10-CM | POA: Diagnosis not present

## 2022-08-14 DIAGNOSIS — Z471 Aftercare following joint replacement surgery: Secondary | ICD-10-CM | POA: Diagnosis not present

## 2022-08-16 DIAGNOSIS — M25561 Pain in right knee: Secondary | ICD-10-CM | POA: Diagnosis not present

## 2022-08-16 DIAGNOSIS — M25661 Stiffness of right knee, not elsewhere classified: Secondary | ICD-10-CM | POA: Diagnosis not present

## 2022-08-19 DIAGNOSIS — M25661 Stiffness of right knee, not elsewhere classified: Secondary | ICD-10-CM | POA: Diagnosis not present

## 2022-08-19 DIAGNOSIS — M25561 Pain in right knee: Secondary | ICD-10-CM | POA: Diagnosis not present

## 2022-08-21 DIAGNOSIS — M25661 Stiffness of right knee, not elsewhere classified: Secondary | ICD-10-CM | POA: Diagnosis not present

## 2022-08-21 DIAGNOSIS — M25561 Pain in right knee: Secondary | ICD-10-CM | POA: Diagnosis not present

## 2022-08-26 DIAGNOSIS — M25561 Pain in right knee: Secondary | ICD-10-CM | POA: Diagnosis not present

## 2022-08-26 DIAGNOSIS — M25661 Stiffness of right knee, not elsewhere classified: Secondary | ICD-10-CM | POA: Diagnosis not present

## 2022-08-28 DIAGNOSIS — M25561 Pain in right knee: Secondary | ICD-10-CM | POA: Diagnosis not present

## 2022-08-28 DIAGNOSIS — M25661 Stiffness of right knee, not elsewhere classified: Secondary | ICD-10-CM | POA: Diagnosis not present

## 2022-09-02 DIAGNOSIS — M25561 Pain in right knee: Secondary | ICD-10-CM | POA: Diagnosis not present

## 2022-09-02 DIAGNOSIS — M25661 Stiffness of right knee, not elsewhere classified: Secondary | ICD-10-CM | POA: Diagnosis not present

## 2022-09-04 DIAGNOSIS — M25561 Pain in right knee: Secondary | ICD-10-CM | POA: Diagnosis not present

## 2022-09-04 DIAGNOSIS — M25661 Stiffness of right knee, not elsewhere classified: Secondary | ICD-10-CM | POA: Diagnosis not present

## 2022-09-11 DIAGNOSIS — E7800 Pure hypercholesterolemia, unspecified: Secondary | ICD-10-CM | POA: Diagnosis not present

## 2022-09-11 DIAGNOSIS — E1169 Type 2 diabetes mellitus with other specified complication: Secondary | ICD-10-CM | POA: Diagnosis not present

## 2022-09-11 DIAGNOSIS — I10 Essential (primary) hypertension: Secondary | ICD-10-CM | POA: Diagnosis not present

## 2022-09-11 DIAGNOSIS — I700 Atherosclerosis of aorta: Secondary | ICD-10-CM | POA: Diagnosis not present

## 2022-09-11 DIAGNOSIS — E119 Type 2 diabetes mellitus without complications: Secondary | ICD-10-CM | POA: Diagnosis not present

## 2022-10-01 DIAGNOSIS — K080 Exfoliation of teeth due to systemic causes: Secondary | ICD-10-CM | POA: Diagnosis not present

## 2022-11-11 DIAGNOSIS — E1169 Type 2 diabetes mellitus with other specified complication: Secondary | ICD-10-CM | POA: Diagnosis not present

## 2023-01-27 DIAGNOSIS — H40023 Open angle with borderline findings, high risk, bilateral: Secondary | ICD-10-CM | POA: Diagnosis not present

## 2023-01-27 DIAGNOSIS — E119 Type 2 diabetes mellitus without complications: Secondary | ICD-10-CM | POA: Diagnosis not present

## 2023-01-27 DIAGNOSIS — H35371 Puckering of macula, right eye: Secondary | ICD-10-CM | POA: Diagnosis not present

## 2023-01-27 DIAGNOSIS — H26492 Other secondary cataract, left eye: Secondary | ICD-10-CM | POA: Diagnosis not present

## 2023-03-03 DIAGNOSIS — G4733 Obstructive sleep apnea (adult) (pediatric): Secondary | ICD-10-CM | POA: Diagnosis not present

## 2023-03-03 DIAGNOSIS — Z1331 Encounter for screening for depression: Secondary | ICD-10-CM | POA: Diagnosis not present

## 2023-03-03 DIAGNOSIS — I10 Essential (primary) hypertension: Secondary | ICD-10-CM | POA: Diagnosis not present

## 2023-03-03 DIAGNOSIS — Z125 Encounter for screening for malignant neoplasm of prostate: Secondary | ICD-10-CM | POA: Diagnosis not present

## 2023-03-03 DIAGNOSIS — Z0000 Encounter for general adult medical examination without abnormal findings: Secondary | ICD-10-CM | POA: Diagnosis not present

## 2023-03-03 DIAGNOSIS — Z23 Encounter for immunization: Secondary | ICD-10-CM | POA: Diagnosis not present

## 2023-03-03 DIAGNOSIS — I700 Atherosclerosis of aorta: Secondary | ICD-10-CM | POA: Diagnosis not present

## 2023-03-03 DIAGNOSIS — E7800 Pure hypercholesterolemia, unspecified: Secondary | ICD-10-CM | POA: Diagnosis not present

## 2023-03-03 DIAGNOSIS — E119 Type 2 diabetes mellitus without complications: Secondary | ICD-10-CM | POA: Diagnosis not present

## 2023-04-23 DIAGNOSIS — K080 Exfoliation of teeth due to systemic causes: Secondary | ICD-10-CM | POA: Diagnosis not present

## 2023-05-13 DIAGNOSIS — G4733 Obstructive sleep apnea (adult) (pediatric): Secondary | ICD-10-CM | POA: Diagnosis not present

## 2023-08-20 DIAGNOSIS — M25552 Pain in left hip: Secondary | ICD-10-CM | POA: Diagnosis not present

## 2023-08-20 DIAGNOSIS — M1611 Unilateral primary osteoarthritis, right hip: Secondary | ICD-10-CM | POA: Diagnosis not present

## 2023-09-01 DIAGNOSIS — M1610 Unilateral primary osteoarthritis, unspecified hip: Secondary | ICD-10-CM | POA: Diagnosis not present

## 2023-09-01 DIAGNOSIS — E1169 Type 2 diabetes mellitus with other specified complication: Secondary | ICD-10-CM | POA: Diagnosis not present

## 2023-09-01 DIAGNOSIS — I10 Essential (primary) hypertension: Secondary | ICD-10-CM | POA: Diagnosis not present

## 2023-09-01 DIAGNOSIS — E7800 Pure hypercholesterolemia, unspecified: Secondary | ICD-10-CM | POA: Diagnosis not present

## 2023-09-01 DIAGNOSIS — E669 Obesity, unspecified: Secondary | ICD-10-CM | POA: Diagnosis not present

## 2023-09-01 DIAGNOSIS — Z01818 Encounter for other preprocedural examination: Secondary | ICD-10-CM | POA: Diagnosis not present

## 2023-11-11 DIAGNOSIS — K080 Exfoliation of teeth due to systemic causes: Secondary | ICD-10-CM | POA: Diagnosis not present

## 2023-11-27 DIAGNOSIS — M1611 Unilateral primary osteoarthritis, right hip: Secondary | ICD-10-CM | POA: Diagnosis not present

## 2023-11-27 DIAGNOSIS — M25451 Effusion, right hip: Secondary | ICD-10-CM | POA: Diagnosis not present

## 2024-01-14 DIAGNOSIS — Z5189 Encounter for other specified aftercare: Secondary | ICD-10-CM | POA: Diagnosis not present

## 2024-01-20 DIAGNOSIS — K080 Exfoliation of teeth due to systemic causes: Secondary | ICD-10-CM | POA: Diagnosis not present

## 2024-01-29 DIAGNOSIS — H40013 Open angle with borderline findings, low risk, bilateral: Secondary | ICD-10-CM | POA: Diagnosis not present

## 2024-01-29 DIAGNOSIS — E119 Type 2 diabetes mellitus without complications: Secondary | ICD-10-CM | POA: Diagnosis not present

## 2024-01-29 DIAGNOSIS — H35371 Puckering of macula, right eye: Secondary | ICD-10-CM | POA: Diagnosis not present

## 2024-01-29 DIAGNOSIS — H43813 Vitreous degeneration, bilateral: Secondary | ICD-10-CM | POA: Diagnosis not present
